# Patient Record
Sex: Female | Born: 1980
Health system: Southern US, Community
[De-identification: ages and names within clinical notes are randomized; demographics above are authoritative.]

## PROBLEM LIST (undated history)

## (undated) ENCOUNTER — Inpatient Hospital Stay (HOSPITAL_COMMUNITY): Payer: Self-pay

## (undated) DIAGNOSIS — I10 Essential (primary) hypertension: Secondary | ICD-10-CM

## (undated) DIAGNOSIS — IMO0002 Reserved for concepts with insufficient information to code with codable children: Secondary | ICD-10-CM

## (undated) DIAGNOSIS — R87619 Unspecified abnormal cytological findings in specimens from cervix uteri: Secondary | ICD-10-CM

## (undated) HISTORY — PX: CERVIX LESION DESTRUCTION: SHX591

## (undated) HISTORY — DX: Essential (primary) hypertension: I10

---

## 2003-11-23 ENCOUNTER — Emergency Department (HOSPITAL_COMMUNITY): Admission: EM | Admit: 2003-11-23 | Discharge: 2003-11-23 | Payer: Self-pay | Admitting: Emergency Medicine

## 2004-07-06 ENCOUNTER — Ambulatory Visit (HOSPITAL_COMMUNITY): Admission: RE | Admit: 2004-07-06 | Discharge: 2004-07-06 | Payer: Self-pay | Admitting: Obstetrics

## 2004-07-14 ENCOUNTER — Inpatient Hospital Stay (HOSPITAL_COMMUNITY): Admission: AD | Admit: 2004-07-14 | Discharge: 2004-07-14 | Payer: Self-pay | Admitting: Obstetrics

## 2004-07-27 ENCOUNTER — Inpatient Hospital Stay (HOSPITAL_COMMUNITY): Admission: AD | Admit: 2004-07-27 | Discharge: 2004-07-27 | Payer: Self-pay | Admitting: Obstetrics

## 2004-09-08 ENCOUNTER — Inpatient Hospital Stay (HOSPITAL_COMMUNITY): Admission: AD | Admit: 2004-09-08 | Discharge: 2004-09-08 | Payer: Self-pay | Admitting: Obstetrics and Gynecology

## 2004-11-08 ENCOUNTER — Inpatient Hospital Stay (HOSPITAL_COMMUNITY): Admission: AD | Admit: 2004-11-08 | Discharge: 2004-11-08 | Payer: Self-pay | Admitting: Obstetrics and Gynecology

## 2004-11-30 ENCOUNTER — Inpatient Hospital Stay (HOSPITAL_COMMUNITY): Admission: AD | Admit: 2004-11-30 | Discharge: 2004-11-30 | Payer: Self-pay | Admitting: Obstetrics & Gynecology

## 2004-12-01 ENCOUNTER — Inpatient Hospital Stay (HOSPITAL_COMMUNITY): Admission: AD | Admit: 2004-12-01 | Discharge: 2004-12-01 | Payer: Self-pay | Admitting: Obstetrics and Gynecology

## 2004-12-01 ENCOUNTER — Inpatient Hospital Stay (HOSPITAL_COMMUNITY): Admission: AD | Admit: 2004-12-01 | Discharge: 2004-12-01 | Payer: Self-pay | Admitting: *Deleted

## 2004-12-03 ENCOUNTER — Inpatient Hospital Stay (HOSPITAL_COMMUNITY): Admission: AD | Admit: 2004-12-03 | Discharge: 2004-12-03 | Payer: Self-pay | Admitting: Obstetrics & Gynecology

## 2004-12-15 ENCOUNTER — Inpatient Hospital Stay (HOSPITAL_COMMUNITY): Admission: AD | Admit: 2004-12-15 | Discharge: 2004-12-15 | Payer: Self-pay | Admitting: Obstetrics and Gynecology

## 2004-12-16 ENCOUNTER — Inpatient Hospital Stay (HOSPITAL_COMMUNITY): Admission: AD | Admit: 2004-12-16 | Discharge: 2004-12-16 | Payer: Self-pay | Admitting: Obstetrics and Gynecology

## 2004-12-23 ENCOUNTER — Inpatient Hospital Stay (HOSPITAL_COMMUNITY): Admission: AD | Admit: 2004-12-23 | Discharge: 2004-12-23 | Payer: Self-pay | Admitting: Obstetrics and Gynecology

## 2004-12-27 ENCOUNTER — Inpatient Hospital Stay (HOSPITAL_COMMUNITY): Admission: AD | Admit: 2004-12-27 | Discharge: 2004-12-27 | Payer: Self-pay | Admitting: Obstetrics & Gynecology

## 2005-01-04 ENCOUNTER — Inpatient Hospital Stay (HOSPITAL_COMMUNITY): Admission: AD | Admit: 2005-01-04 | Discharge: 2005-01-04 | Payer: Self-pay | Admitting: Obstetrics and Gynecology

## 2005-01-25 ENCOUNTER — Inpatient Hospital Stay (HOSPITAL_COMMUNITY): Admission: AD | Admit: 2005-01-25 | Discharge: 2005-01-25 | Payer: Self-pay | Admitting: Obstetrics & Gynecology

## 2005-01-27 ENCOUNTER — Inpatient Hospital Stay (HOSPITAL_COMMUNITY): Admission: AD | Admit: 2005-01-27 | Discharge: 2005-01-27 | Payer: Self-pay | Admitting: Obstetrics and Gynecology

## 2005-02-13 ENCOUNTER — Inpatient Hospital Stay (HOSPITAL_COMMUNITY): Admission: AD | Admit: 2005-02-13 | Discharge: 2005-02-16 | Payer: Self-pay | Admitting: Obstetrics and Gynecology

## 2005-02-14 ENCOUNTER — Encounter (INDEPENDENT_AMBULATORY_CARE_PROVIDER_SITE_OTHER): Payer: Self-pay | Admitting: *Deleted

## 2010-09-04 ENCOUNTER — Emergency Department (HOSPITAL_COMMUNITY)
Admission: EM | Admit: 2010-09-04 | Discharge: 2010-09-04 | Payer: Self-pay | Source: Home / Self Care | Admitting: Emergency Medicine
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
## 2012-03-30 ENCOUNTER — Inpatient Hospital Stay (HOSPITAL_COMMUNITY)
Admission: AD | Admit: 2012-03-30 | Discharge: 2012-03-31 | Disposition: A | Discharge status: Home / Self Care | Payer: Medicaid Other | Source: Ambulatory Visit | Attending: Obstetrics and Gynecology | Admitting: Obstetrics and Gynecology

## 2012-03-30 ENCOUNTER — Inpatient Hospital Stay (HOSPITAL_COMMUNITY): Payer: Medicaid Other

## 2012-03-30 ENCOUNTER — Encounter (HOSPITAL_COMMUNITY): Payer: Self-pay | Admitting: *Deleted

## 2012-03-30 DIAGNOSIS — A499 Bacterial infection, unspecified: Secondary | ICD-10-CM | POA: Insufficient documentation

## 2012-03-30 DIAGNOSIS — B9689 Other specified bacterial agents as the cause of diseases classified elsewhere: Secondary | ICD-10-CM | POA: Insufficient documentation

## 2012-03-30 DIAGNOSIS — N390 Urinary tract infection, site not specified: Secondary | ICD-10-CM | POA: Insufficient documentation

## 2012-03-30 DIAGNOSIS — O2390 Unspecified genitourinary tract infection in pregnancy, unspecified trimester: Secondary | ICD-10-CM | POA: Insufficient documentation

## 2012-03-30 DIAGNOSIS — R109 Unspecified abdominal pain: Secondary | ICD-10-CM | POA: Insufficient documentation

## 2012-03-30 DIAGNOSIS — N760 Acute vaginitis: Secondary | ICD-10-CM | POA: Insufficient documentation

## 2012-03-30 HISTORY — DX: Unspecified abnormal cytological findings in specimens from cervix uteri: R87.619

## 2012-03-30 HISTORY — DX: Reserved for concepts with insufficient information to code with codable children: IMO0002

## 2012-03-30 LAB — URINALYSIS, ROUTINE W REFLEX MICROSCOPIC
Bilirubin Urine: NEGATIVE
Glucose, UA: NEGATIVE mg/dL
Hgb urine dipstick: NEGATIVE
Ketones, ur: NEGATIVE mg/dL
Nitrite: NEGATIVE
Protein, ur: NEGATIVE mg/dL
Specific Gravity, Urine: 1.02 (ref 1.005–1.030)
Urobilinogen, UA: 0.2 mg/dL (ref 0.0–1.0)
pH: 6.5 (ref 5.0–8.0)

## 2012-03-30 LAB — CBC WITH DIFFERENTIAL/PLATELET
Basophils Absolute: 0 10*3/uL (ref 0.0–0.1)
Basophils Relative: 0 % (ref 0–1)
Eosinophils Absolute: 0.1 10*3/uL (ref 0.0–0.7)
Eosinophils Relative: 2 % (ref 0–5)
HCT: 36.2 % (ref 36.0–46.0)
Hemoglobin: 12.4 g/dL (ref 12.0–15.0)
Lymphocytes Relative: 27 % (ref 12–46)
Lymphs Abs: 1.7 10*3/uL (ref 0.7–4.0)
MCH: 30.3 pg (ref 26.0–34.0)
MCHC: 34.3 g/dL (ref 30.0–36.0)
MCV: 88.5 fL (ref 78.0–100.0)
Monocytes Absolute: 0.4 10*3/uL (ref 0.1–1.0)
Monocytes Relative: 6 % (ref 3–12)
Neutro Abs: 4.1 10*3/uL (ref 1.7–7.7)
Neutrophils Relative %: 65 % (ref 43–77)
Platelets: 198 10*3/uL (ref 150–400)
RBC: 4.09 MIL/uL (ref 3.87–5.11)
RDW: 13.9 % (ref 11.5–15.5)
WBC: 6.2 10*3/uL (ref 4.0–10.5)

## 2012-03-30 LAB — POCT PREGNANCY, URINE: Preg Test, Ur: POSITIVE — AB

## 2012-03-30 LAB — HCG, QUANTITATIVE, PREGNANCY: hCG, Beta Chain, Quant, S: 20081 m[IU]/mL — ABNORMAL HIGH (ref <5)

## 2012-03-30 LAB — WET PREP, GENITAL
Trich, Wet Prep: NONE SEEN
Yeast Wet Prep HPF POC: NONE SEEN

## 2012-03-30 LAB — ABO/RH: ABO/RH(D): B POS

## 2012-03-30 LAB — URINE MICROSCOPIC-ADD ON

## 2012-03-30 NOTE — MAU Provider Note (Signed)
History     CSN: [id number]  Arrival date and time: 03/30/12 1451    HPI [name] is a 31 y.o. female who presents to MAU for abdominal pain in early pregnancy. The pain started about a week ago and last night got really bad. She describes the pain as cramping that comes and goes. She rates the pain as 6/10 today. Associated symptoms include nausea, vomiting and fatigue. She noted spotting 5 days ago but none since. Last pap smear less than one year ago and was normal. Current sex partner > 1 year. Hx of Chlamydia. The history was provided by the patient.  OB History    Grav Para Term Preterm Abortions TAB SAB Ect Mult Living   5 2 1 1 2  2   1       Past Medical History  Diagnosis Date  . Abnormal Pap smear     Past Surgical History  Procedure Date  . Cervix lesion destruction     Family History  Problem Relation Age of Onset  . Hypertension Mother   . Hypertension Maternal Grandmother   . Hypertension Maternal Grandfather     History  Substance Use Topics  . Smoking status: Current Everyday Smoker -- 0.5 packs/day for 12 years    Types: Cigarettes  . Smokeless tobacco: Not on file  . Alcohol Use: Yes     socially    Allergies: No Known Allergies  Prescriptions prior to admission  Medication Sig Dispense Refill  . acetaminophen (TYLENOL) 325 MG tablet Take 650 mg by mouth every 6 (six) hours as needed. For pain        Review of Systems  Constitutional: Negative for fever, chills and weight loss. Malaise/fatigue: tired.  HENT: Negative for ear pain, nosebleeds, congestion, sore throat and neck pain.   Eyes: Negative for blurred vision, double vision, photophobia and pain.  Respiratory: Negative for cough, shortness of breath and wheezing.   Cardiovascular: Negative for chest pain, palpitations and leg swelling.  Gastrointestinal: Positive for heartburn, nausea, vomiting and abdominal pain. Negative for diarrhea and constipation.  Genitourinary: Positive  for frequency. Negative for dysuria and urgency.       Vaginal spotting last week, vaginal discharge  Musculoskeletal: Negative for myalgias and back pain.  Skin: Negative for itching and rash.  Neurological: Negative for dizziness, sensory change, speech change, seizures, weakness and headaches.  Endo/Heme/Allergies: Does not bruise/bleed easily.  Psychiatric/Behavioral: Negative for depression. The patient is not nervous/anxious.    Physical Exam   Blood pressure 93/61, pulse 78, temperature 97.8 F (36.6 C), temperature source Oral, resp. rate 16, height 5\' 4"  (1.626 m), weight 135 lb (61.236 kg), last menstrual period 02/19/2012.  Physical Exam  Nursing note and vitals reviewed. Constitutional: She is oriented to person, place, and time. She appears well-developed and well-nourished. No distress.  HENT:  Head: Normocephalic and atraumatic.  Eyes: EOM are normal.  Neck: Neck supple.  Cardiovascular: Normal rate.   Respiratory: Effort normal.  GI: Soft. There is tenderness. There is no rebound and no guarding.       Minimal tenderness lower abdomen with palpation  Genitourinary:       External genitalia without lesions. White discharge vaginal vault, no blood noted. Cervix closed, long, no CMT, minimal tenderness bilateral adnexa. Uterus approximately 6 to 8 week size.  Musculoskeletal: Normal range of motion.  Neurological: She is alert and oriented to person, place, and time.  Skin: Skin is warm and dry.  Psychiatric:  She has a normal mood and affect. Her behavior is normal. Judgment and thought content normal.   B+, normal CBC   Procedures  Study Result     *RADIOLOGY REPORT*  Clinical Data: Pregnant, spotting, pelvic pain  OBSTETRIC <14 WK ULTRASOUND  Technique: Transabdominal ultrasound was performed for evaluation  of the gestation as well as the maternal uterus and adnexal  regions.  Comparison: None.  Intrauterine gestational sac: Visualized/normal in shape.    Yolk sac: Present  Embryo: Present  Cardiac Activity: Present  Heart Rate: 98 bpm  CRL: 4.3 mm 6 w 1 d  [place] EDC: 11/22/2012  Maternal uterus/Adnexae:  Small subchorionic hemorrhage.  Right ovary is within normal limits, measuring 2.3 x 2.9 x 2.9 cm,  and is notable for a corpus luteum.  Left ovary is within normal limits, measuring 1.6 x 3.5 x 1.9 cm.  The  No free fluid.  IMPRESSION:  Single live intrauterine gestation with estimated gestational age [redacted]  weeks 1 day by crown-rump length.  Fetal heart rate 98 bpm.  Small subchorionic hemorrhage.  Original Report Authenticated By: Charline Bills, M.D.   Assessment: Viable IUP  Plan:  Start prenatal care  NOTE: Computer system went down prior to patient discharge. Remainder of visit was done on paper and sent to medical records. Kasir Hallenbeck 03/30/2012, 4:20 PM

## 2012-03-30 NOTE — MAU Note (Signed)
Pt states she had a +HPT two days ago, and has been cramping for the past week and a half.  She also states she is not emptying her bladder since the cramping started. Denies any bleeding for the past few days.

## 2012-03-31 LAB — GC/CHLAMYDIA PROBE AMP, GENITAL
Chlamydia, DNA Probe: NEGATIVE
GC Probe Amp, Genital: NEGATIVE

## 2012-04-01 LAB — URINE CULTURE

## 2012-04-14 NOTE — MAU Provider Note (Signed)
Attestation of Attending Supervision of Advanced Practitioner: Evaluation and management procedures were performed by the PA/NP/CNM/OB Fellow under my supervision/collaboration. Chart reviewed and agree with management and plan.  Genora Arp V 04/14/2012 6:34 AM    

## 2012-05-14 ENCOUNTER — Other Ambulatory Visit (HOSPITAL_COMMUNITY): Payer: Self-pay | Admitting: Nurse Practitioner

## 2012-05-14 DIAGNOSIS — Z3682 Encounter for antenatal screening for nuchal translucency: Secondary | ICD-10-CM

## 2012-05-14 LAB — OB RESULTS CONSOLE GC/CHLAMYDIA
Chlamydia: NEGATIVE
Gonorrhea: NEGATIVE

## 2012-05-14 LAB — SICKLE CELL SCREEN: Sickle Cell Screen: NORMAL

## 2012-05-14 LAB — OB RESULTS CONSOLE RPR: RPR: NONREACTIVE

## 2012-05-14 LAB — GLUCOSE TOLERANCE, 1 HOUR (50G) W/O FASTING: Glucose, 1 Hour GTT: 82

## 2012-05-14 LAB — OB RESULTS CONSOLE HGB/HCT, BLOOD: HCT: 34 %

## 2012-05-14 LAB — OB RESULTS CONSOLE ABO/RH: RH Type: POSITIVE

## 2012-05-14 LAB — OB RESULTS CONSOLE HEPATITIS B SURFACE ANTIGEN: Hepatitis B Surface Ag: NEGATIVE

## 2012-05-14 LAB — OB RESULTS CONSOLE VARICELLA ZOSTER ANTIBODY, IGG: Varicella: IMMUNE

## 2012-05-14 LAB — OB RESULTS CONSOLE ANTIBODY SCREEN: Antibody Screen: NEGATIVE

## 2012-05-14 LAB — OB RESULTS CONSOLE RUBELLA ANTIBODY, IGM: Rubella: IMMUNE

## 2012-05-14 LAB — CYTOLOGY - PAP: CYTOLOGY - PAP: NEGATIVE

## 2012-05-20 ENCOUNTER — Ambulatory Visit (HOSPITAL_COMMUNITY)
Admission: RE | Admit: 2012-05-20 | Discharge: 2012-05-20 | Disposition: A | Discharge status: Home / Self Care | Payer: Medicaid Other | Source: Ambulatory Visit | Attending: Nurse Practitioner | Admitting: Nurse Practitioner

## 2012-05-20 ENCOUNTER — Encounter (HOSPITAL_COMMUNITY): Payer: Self-pay

## 2012-05-20 VITALS — BP 99/61 | HR 100 | Wt 135.0 lb

## 2012-05-20 DIAGNOSIS — Z3682 Encounter for antenatal screening for nuchal translucency: Secondary | ICD-10-CM

## 2012-05-20 DIAGNOSIS — Z1389 Encounter for screening for other disorder: Secondary | ICD-10-CM

## 2012-05-20 DIAGNOSIS — O3510X0 Maternal care for (suspected) chromosomal abnormality in fetus, unspecified, not applicable or unspecified: Secondary | ICD-10-CM | POA: Insufficient documentation

## 2012-05-20 DIAGNOSIS — O351XX0 Maternal care for (suspected) chromosomal abnormality in fetus, not applicable or unspecified: Secondary | ICD-10-CM | POA: Insufficient documentation

## 2012-05-20 DIAGNOSIS — O09299 Supervision of pregnancy with other poor reproductive or obstetric history, unspecified trimester: Secondary | ICD-10-CM | POA: Insufficient documentation

## 2012-05-20 DIAGNOSIS — Z3689 Encounter for other specified antenatal screening: Secondary | ICD-10-CM | POA: Insufficient documentation

## 2012-05-20 NOTE — Progress Notes (Signed)
[name]  was seen today for an ultrasound appointment.  See full report in [facility].  Alpha Gula, MD  Single IUP at 13 0/7 weeks NT of 1.4 mm noted.  A nasal bone was visualized First trimester screen performed  Recommend follow up in 6 weeks for anatomic survey.  Also recommend MSAFP testing in the second trimester for ONTD screening.

## 2012-05-21 ENCOUNTER — Encounter: Payer: Self-pay | Admitting: Obstetrics and Gynecology

## 2012-05-21 DIAGNOSIS — O09299 Supervision of pregnancy with other poor reproductive or obstetric history, unspecified trimester: Secondary | ICD-10-CM

## 2012-05-26 ENCOUNTER — Encounter: Payer: Self-pay | Admitting: Obstetrics and Gynecology

## 2012-05-26 DIAGNOSIS — O09299 Supervision of pregnancy with other poor reproductive or obstetric history, unspecified trimester: Secondary | ICD-10-CM | POA: Insufficient documentation

## 2012-05-27 DIAGNOSIS — O09299 Supervision of pregnancy with other poor reproductive or obstetric history, unspecified trimester: Secondary | ICD-10-CM

## 2012-05-29 ENCOUNTER — Encounter: Payer: Self-pay | Admitting: Obstetrics & Gynecology

## 2012-05-29 ENCOUNTER — Ambulatory Visit (INDEPENDENT_AMBULATORY_CARE_PROVIDER_SITE_OTHER): Payer: Medicaid Other | Admitting: Obstetrics & Gynecology

## 2012-05-29 VITALS — BP 89/57 | Temp 97.1°F | Wt 135.8 lb

## 2012-05-29 DIAGNOSIS — N390 Urinary tract infection, site not specified: Secondary | ICD-10-CM

## 2012-05-29 DIAGNOSIS — F17200 Nicotine dependence, unspecified, uncomplicated: Secondary | ICD-10-CM

## 2012-05-29 DIAGNOSIS — O3440 Maternal care for other abnormalities of cervix, unspecified trimester: Secondary | ICD-10-CM

## 2012-05-29 DIAGNOSIS — O09299 Supervision of pregnancy with other poor reproductive or obstetric history, unspecified trimester: Secondary | ICD-10-CM

## 2012-05-29 DIAGNOSIS — O0990 Supervision of high risk pregnancy, unspecified, unspecified trimester: Secondary | ICD-10-CM

## 2012-05-29 DIAGNOSIS — F17210 Nicotine dependence, cigarettes, uncomplicated: Secondary | ICD-10-CM

## 2012-05-29 DIAGNOSIS — Z8619 Personal history of other infectious and parasitic diseases: Secondary | ICD-10-CM

## 2012-05-29 LAB — POCT URINALYSIS DIP (DEVICE)
Bilirubin Urine: NEGATIVE
Glucose, UA: NEGATIVE mg/dL
Nitrite: NEGATIVE
Urobilinogen, UA: 0.2 mg/dL (ref 0.0–1.0)
pH: 6 (ref 5.0–8.0)

## 2012-05-29 NOTE — Assessment & Plan Note (Signed)
Refused nicotine patches.  SW to address [place] quit line.  Pt aware that smoking can cause IUFD and placental abruption

## 2012-05-29 NOTE — Progress Notes (Signed)
U/S scheduled in MFM on 06/27/12 at 830 am.

## 2012-05-29 NOTE — Progress Notes (Signed)
Pulse- 82  Pt given "mom packet".  Pt already set up with [facility].

## 2012-05-29 NOTE — Progress Notes (Signed)
No nutrition or SW her today.  Nausea is better with phenergan.  Needs AFP only at next visit.  Needs anatomy US with MFM.  Unknown reason for 24-26 week fetal demise.  Will start weekly BPP at 28 weeks then 2x week testing at 32 weeks.  1+ protein on urine dip (clean catch).  Pt was treated for UTI in July but did not finish medication.  Will resend U cx today.

## 2012-05-29 NOTE — Progress Notes (Signed)
First visit with RD.  Client receives [facility].  Wt loss of 4.2# based on reported pg weight of 140.  Pt. Reports excessive nausea.  Now on Phenergan which seems to be helping with nausea.   Client reports eating 1 meal and 2-3 snacks/d.  Discussed importance of eating 5-6 small meals and snacks each day and other tips for combating nausea.  Discussed weight gain goals Of 25-35#.  F/U as needed. Candice C. Earlene Plater, MPH, RD, LDN

## 2012-05-29 NOTE — Patient Instructions (Signed)

## 2012-06-02 LAB — CULTURE, OB URINE

## 2012-06-26 ENCOUNTER — Ambulatory Visit (INDEPENDENT_AMBULATORY_CARE_PROVIDER_SITE_OTHER): Payer: Medicaid Other | Admitting: Advanced Practice Midwife

## 2012-06-26 VITALS — BP 100/63 | Temp 97.0°F | Wt 147.6 lb

## 2012-06-26 DIAGNOSIS — O3440 Maternal care for other abnormalities of cervix, unspecified trimester: Secondary | ICD-10-CM

## 2012-06-26 DIAGNOSIS — O0990 Supervision of high risk pregnancy, unspecified, unspecified trimester: Secondary | ICD-10-CM

## 2012-06-26 DIAGNOSIS — O09299 Supervision of pregnancy with other poor reproductive or obstetric history, unspecified trimester: Secondary | ICD-10-CM

## 2012-06-26 LAB — POCT URINALYSIS DIP (DEVICE)
Hgb urine dipstick: NEGATIVE
Ketones, ur: NEGATIVE mg/dL
Protein, ur: NEGATIVE mg/dL
pH: 6.5 (ref 5.0–8.0)

## 2012-06-26 NOTE — Progress Notes (Signed)
Doing well. Feeling fetal movement, denies vaginal bleeding, LOF, cramping/contractions. Fundal height U-1.  AFP drawn today.

## 2012-06-26 NOTE — Progress Notes (Signed)
Pulse: 86 Pt notices some swelling in her legs at end of day but goes away with rest. Has some pelvic soreness.

## 2012-06-27 ENCOUNTER — Encounter (HOSPITAL_COMMUNITY): Payer: Self-pay | Admitting: *Deleted

## 2012-06-27 ENCOUNTER — Ambulatory Visit (HOSPITAL_COMMUNITY)
Admission: RE | Admit: 2012-06-27 | Payer: Medicaid Other | Source: Ambulatory Visit | Attending: Nurse Practitioner | Admitting: Nurse Practitioner

## 2012-06-27 NOTE — Care Management (Signed)
Dr. Macon Large notified that patient called to reschedule detail [name]. Pt states she is unable to come until the end of November due to scheduling conflicts.

## 2012-07-02 ENCOUNTER — Ambulatory Visit (HOSPITAL_COMMUNITY): Payer: Medicaid Other

## 2012-07-10 ENCOUNTER — Encounter: Payer: Self-pay | Admitting: Advanced Practice Midwife

## 2012-07-24 ENCOUNTER — Ambulatory Visit (INDEPENDENT_AMBULATORY_CARE_PROVIDER_SITE_OTHER): Payer: Medicaid Other | Admitting: Obstetrics & Gynecology

## 2012-07-24 VITALS — BP 104/65 | Temp 97.4°F | Wt 161.8 lb

## 2012-07-24 DIAGNOSIS — O0990 Supervision of high risk pregnancy, unspecified, unspecified trimester: Secondary | ICD-10-CM

## 2012-07-24 DIAGNOSIS — Z8619 Personal history of other infectious and parasitic diseases: Secondary | ICD-10-CM

## 2012-07-24 DIAGNOSIS — O99330 Smoking (tobacco) complicating pregnancy, unspecified trimester: Secondary | ICD-10-CM

## 2012-07-24 DIAGNOSIS — O09299 Supervision of pregnancy with other poor reproductive or obstetric history, unspecified trimester: Secondary | ICD-10-CM

## 2012-07-24 LAB — POCT URINALYSIS DIP (DEVICE)
Leukocytes, UA: NEGATIVE
Protein, ur: NEGATIVE mg/dL
Specific Gravity, Urine: 1.015 (ref 1.005–1.030)
Urobilinogen, UA: 0.2 mg/dL (ref 0.0–1.0)

## 2012-07-24 NOTE — Patient Instructions (Signed)
Breastfeeding Deciding to breastfeed is one of the best choices you can make for you and your baby. The information that follows gives a brief overview of the benefits of breastfeeding as well as common topics surrounding breastfeeding. BENEFITS OF BREASTFEEDING For the baby  The first milk (colostrum) helps the baby's digestive system function better.   There are antibodies in the mother's milk that help the baby fight off infections.   The baby has a lower incidence of asthma, allergies, and sudden infant death syndrome (SIDS).   The nutrients in breast milk are better for the baby than infant formulas, and breast milk helps the baby's brain grow better.   Babies who breastfeed have less gas, colic, and constipation.  For the mother  Breastfeeding helps develop a very special bond between the mother and her baby.   Breastfeeding is convenient, always available at the correct temperature, and costs nothing.   Breastfeeding burns calories in the mother and helps her lose weight that was gained during pregnancy.   Breastfeeding makes the uterus contract back down to normal size faster and slows bleeding following delivery.   Breastfeeding mothers have a lower risk of developing breast cancer.  BREASTFEEDING FREQUENCY  A healthy, full-term baby may breastfeed as often as every hour or space his or her feedings to every 3 hours.   Watch your baby for signs of hunger. Nurse your baby if he or she shows signs of hunger. How often you nurse will vary from baby to baby.   Nurse as often as the baby requests, or when you feel the need to reduce the fullness of your breasts.   Awaken the baby if it has been 3 4 hours since the last feeding.   Frequent feeding will help the mother make more milk and will help prevent problems, such as sore nipples and engorgement of the breasts.  BABY'S POSITION AT THE BREAST  Whether lying down or sitting, be sure that the baby's tummy is  facing your tummy.   Support the breast with 4 fingers underneath the breast and the thumb above. Make sure your fingers are well away from the nipple and baby's mouth.   Stroke the baby's lips gently with your finger or nipple.   When the baby's mouth is open wide enough, place all of your nipple and as much of the areola as possible into your baby's mouth.   Pull the baby in close so the tip of the nose and the baby's cheeks touch the breast during the feeding.  FEEDINGS AND SUCTION  The length of each feeding varies from baby to baby and from feeding to feeding.   The baby must suck about 2 3 minutes for your milk to get to him or her. This is called a "let down." For this reason, allow the baby to feed on each breast as long as he or she wants. Your baby will end the feeding when he or she has received the right balance of nutrients.   To break the suction, put your finger into the corner of the baby's mouth and slide it between his or her gums before removing your breast from his or her mouth. This will help prevent sore nipples.  HOW TO TELL WHETHER YOUR BABY IS GETTING ENOUGH BREAST MILK. Wondering whether or not your baby is getting enough milk is a common concern among mothers. You can be assured that your baby is getting enough milk if:   Your baby is actively   sucking and you hear swallowing.   Your baby seems relaxed and satisfied after a feeding.   Your baby nurses at least 8 12 times in a 24 hour time period. Nurse your baby until he or she unlatches or falls asleep at the first breast (at least 10 20 minutes), then offer the second side.   Your baby is wetting 5 6 disposable diapers (6 8 cloth diapers) in a 24 hour period by 5 6 days of age.   Your baby is having at least 3 4 stools every 24 hours for the first 6 weeks. The stool should be soft and yellow.   Your baby should gain 4 7 ounces per week after he or she is 4 days old.   Your breasts feel softer  after nursing.  REDUCING BREAST ENGORGEMENT  In the first week after your baby is born, you may experience signs of breast engorgement. When breasts are engorged, they feel heavy, warm, full, and may be tender to the touch. You can reduce engorgement if you:   Nurse frequently, every 2 3 hours. Mothers who breastfeed early and often have fewer problems with engorgement.   Place light ice packs on your breasts for 10 20 minutes between feedings. This reduces swelling. Wrap the ice packs in a lightweight towel to protect your skin. Bags of frozen vegetables work well for this purpose.   Take a warm shower or apply warm, moist heat to your breast for 5 10 minutes just before each feeding. This increases circulation and helps the milk flow.   Gently massage your breast before and during the feeding. Using your finger tips, massage from the chest wall towards your nipple in a circular motion.   Make sure that the baby empties at least one breast at every feeding before switching sides.   Use a breast pump to empty the breasts if your baby is sleepy or not nursing well. You may also want to pump if you are returning to work oryou feel you are getting engorged.   Avoid bottle feeds, pacifiers, or supplemental feedings of water or juice in place of breastfeeding. Breast milk is all the food your baby needs. It is not necessary for your baby to have water or formula. In fact, to help your breasts make more milk, it is best not to give your baby supplemental feedings during the early weeks.   Be sure the baby is latched on and positioned properly while breastfeeding.   Wear a supportive bra, avoiding underwire styles.   Eat a balanced diet with enough fluids.   Rest often, relax, and take your prenatal vitamins to prevent fatigue, stress, and anemia.  If you follow these suggestions, your engorgement should improve in 24 48 hours. If you are still experiencing difficulty, call your  lactation consultant or caregiver.  CARING FOR YOURSELF Take care of your breasts  Bathe or shower daily.   Avoid using soap on your nipples.   Start feedings on your left breast at one feeding and on your right breast at the next feeding.   You will notice an increase in your milk supply 2 5 days after delivery. You may feel some discomfort from engorgement, which makes your breasts very firm and often tender. Engorgement "peaks" out within 24 48 hours. In the meantime, apply warm moist towels to your breasts for 5 10 minutes before feeding. Gentle massage and expression of some milk before feeding will soften your breasts, making it easier for your   baby to latch on.   Wear a well-fitting nursing bra, and air dry your nipples for a 3 4minutes after each feeding.   Only use cotton bra pads.   Only use pure lanolin on your nipples after nursing. You do not need to wash it off before feeding the baby again. Another option is to express a few drops of breast milk and gently massage it into your nipples.  Take care of yourself  Eat well-balanced meals and nutritious snacks.   Drinking milk, fruit juice, and water to satisfy your thirst (about 8 glasses a day).   Get plenty of rest.  Avoid foods that you notice affect the baby in a bad way.  SEEK MEDICAL CARE IF:   You have difficulty with breastfeeding and need help.   You have a hard, red, sore area on your breast that is accompanied by a fever.   Your baby is too sleepy to eat well or is having trouble sleeping.   Your baby is wetting less than 6 diapers a day, by 5 days of age.   Your baby's skin or white part of his or her eyes is more yellow than it was in the hospital.   You feel depressed.  Document Released: 08/27/2005 Document Revised: 02/26/2012 Document Reviewed: 11/25/2011 [employer] Patient Information 2013 [employer].  

## 2012-07-24 NOTE — Progress Notes (Signed)
Good fetal movement, no complaints.[place] is scheduled for 11/25.

## 2012-07-24 NOTE — Progress Notes (Signed)
P = 84   Pt states she is rarely nauseous now and has improved appetite. Pt has had 14 lb weight gain over last 4 wks.

## 2012-08-04 ENCOUNTER — Ambulatory Visit (HOSPITAL_COMMUNITY)
Admission: RE | Admit: 2012-08-04 | Discharge: 2012-08-04 | Disposition: A | Discharge status: Home / Self Care | Payer: Managed Care, Other (non HMO) | Source: Ambulatory Visit | Attending: Obstetrics & Gynecology | Admitting: Obstetrics & Gynecology

## 2012-08-04 VITALS — BP 98/59 | HR 90 | Wt 166.8 lb

## 2012-08-04 DIAGNOSIS — Z1389 Encounter for screening for other disorder: Secondary | ICD-10-CM | POA: Insufficient documentation

## 2012-08-04 DIAGNOSIS — O0990 Supervision of high risk pregnancy, unspecified, unspecified trimester: Secondary | ICD-10-CM

## 2012-08-04 DIAGNOSIS — Z363 Encounter for antenatal screening for malformations: Secondary | ICD-10-CM | POA: Insufficient documentation

## 2012-08-04 DIAGNOSIS — O358XX0 Maternal care for other (suspected) fetal abnormality and damage, not applicable or unspecified: Secondary | ICD-10-CM | POA: Insufficient documentation

## 2012-08-04 DIAGNOSIS — O09299 Supervision of pregnancy with other poor reproductive or obstetric history, unspecified trimester: Secondary | ICD-10-CM | POA: Insufficient documentation

## 2012-08-04 NOTE — Progress Notes (Signed)
[name] had an ultrasound appointment today.  Please see [facility] report for details.  Comments An active singleton fetus is observed.  Biometry is appropriate for gestational age.  Amniotic fluid volume is normal.  Screening survey of the fetal anatomy was performed and no dysmorphic features are detected.  Impression Active singleton fetus Normal anatomic survey H/o IUFD at 26 wks in previous gestation.  Recommendations 1. I recommend interval growth every 4 weeks; 2. I recommend twice weekly NST with weekly AFI beginning at around 32 weeks. 3. Elective induction may be considered at [redacted] weeks gestational age.  Amber Boga, MD, MS, FACOG Assistant Professor Section of Maternal-Fetal Medicine [facility]

## 2012-08-11 ENCOUNTER — Encounter: Payer: Self-pay | Admitting: Obstetrics & Gynecology

## 2012-08-13 ENCOUNTER — Other Ambulatory Visit (HOSPITAL_COMMUNITY): Payer: Self-pay | Admitting: Obstetrics and Gynecology

## 2012-08-13 DIAGNOSIS — O99330 Smoking (tobacco) complicating pregnancy, unspecified trimester: Secondary | ICD-10-CM

## 2012-08-13 DIAGNOSIS — O09299 Supervision of pregnancy with other poor reproductive or obstetric history, unspecified trimester: Secondary | ICD-10-CM

## 2012-08-14 ENCOUNTER — Ambulatory Visit (INDEPENDENT_AMBULATORY_CARE_PROVIDER_SITE_OTHER): Payer: Medicaid Other | Admitting: Obstetrics & Gynecology

## 2012-08-14 VITALS — BP 93/61 | Temp 97.2°F | Wt 165.7 lb

## 2012-08-14 DIAGNOSIS — O09299 Supervision of pregnancy with other poor reproductive or obstetric history, unspecified trimester: Secondary | ICD-10-CM

## 2012-08-14 DIAGNOSIS — O0990 Supervision of high risk pregnancy, unspecified, unspecified trimester: Secondary | ICD-10-CM

## 2012-08-14 LAB — POCT URINALYSIS DIP (DEVICE)
Glucose, UA: NEGATIVE mg/dL
Hgb urine dipstick: NEGATIVE
Nitrite: NEGATIVE
Urobilinogen, UA: 0.2 mg/dL (ref 0.0–1.0)

## 2012-08-14 NOTE — Progress Notes (Signed)
Still occasional nausea, no emesis or reflux. Not sleeping well, may try Unisom or Benadryl.

## 2012-08-14 NOTE — Patient Instructions (Signed)
Pregnancy - Second Trimester The second trimester of pregnancy (3 to 6 months) is a period of rapid growth for you and your baby. At the end of the sixth month, your baby is about 9 inches long and weighs 1 1/2 pounds. You will begin to feel the baby move between 18 and 20 weeks of the pregnancy. This is called quickening. Weight gain is faster. A clear fluid (colostrum) may leak out of your breasts. You may feel small contractions of the womb (uterus). This is known as false labor or Braxton-Hicks contractions. This is like a practice for labor when the baby is ready to be born. Usually, the problems with morning sickness have usually passed by the end of your first trimester. Some women develop small dark blotches (called cholasma, mask of pregnancy) on their face that usually goes away after the baby is born. Exposure to the sun makes the blotches worse. Acne may also develop in some pregnant women and pregnant women who have acne, may find that it goes away. PRENATAL EXAMS  Blood work may continue to be done during prenatal exams. These tests are done to check on your health and the probable health of your baby. Blood work is used to follow your blood levels (hemoglobin). Anemia (low hemoglobin) is common during pregnancy. Iron and vitamins are given to help prevent this. You will also be checked for diabetes between 24 and 28 weeks of the pregnancy. Some of the previous blood tests may be repeated.  The size of the uterus is measured during each visit. This is to make sure that the baby is continuing to grow properly according to the dates of the pregnancy.  Your blood pressure is checked every prenatal visit. This is to make sure you are not getting toxemia.  Your urine is checked to make sure you do not have an infection, diabetes or protein in the urine.  Your weight is checked often to make sure gains are happening at the suggested rate. This is to ensure that both you and your baby are growing  normally.  Sometimes, an ultrasound is performed to confirm the proper growth and development of the baby. This is a test which bounces harmless sound waves off the baby so your caregiver can more accurately determine due dates. Sometimes, a specialized test is done on the amniotic fluid surrounding the baby. This test is called an amniocentesis. The amniotic fluid is obtained by sticking a needle into the belly (abdomen). This is done to check the chromosomes in instances where there is a concern about possible genetic problems with the baby. It is also sometimes done near the end of pregnancy if an early delivery is required. In this case, it is done to help make sure the baby's lungs are mature enough for the baby to live outside of the womb. CHANGES OCCURING IN THE SECOND TRIMESTER OF PREGNANCY Your body goes through many changes during pregnancy. They vary from person to person. Talk to your caregiver about changes you notice that you are concerned about.  During the second trimester, you will likely have an increase in your appetite. It is normal to have cravings for certain foods. This varies from person to person and pregnancy to pregnancy.  Your lower abdomen will begin to bulge.  You may have to urinate more often because the uterus and baby are pressing on your bladder. It is also common to get more bladder infections during pregnancy (pain with urination). You can help this by   drinking lots of fluids and emptying your bladder before and after intercourse.  You may begin to get stretch marks on your hips, abdomen, and breasts. These are normal changes in the body during pregnancy. There are no exercises or medications to take that prevent this change.  You may begin to develop swollen and bulging veins (varicose veins) in your legs. Wearing support hose, elevating your feet for 15 minutes, 3 to 4 times a day and limiting salt in your diet helps lessen the problem.  Heartburn may develop  as the uterus grows and pushes up against the stomach. Antacids recommended by your caregiver helps with this problem. Also, eating smaller meals 4 to 5 times a day helps.  Constipation can be treated with a stool softener or adding bulk to your diet. Drinking lots of fluids, vegetables, fruits, and whole grains are helpful.  Exercising is also helpful. If you have been very active up until your pregnancy, most of these activities can be continued during your pregnancy. If you have been less active, it is helpful to start an exercise program such as walking.  Hemorrhoids (varicose veins in the rectum) may develop at the end of the second trimester. Warm sitz baths and hemorrhoid cream recommended by your caregiver helps hemorrhoid problems.  Backaches may develop during this time of your pregnancy. Avoid heavy lifting, wear low heal shoes and practice good posture to help with backache problems.  Some pregnant women develop tingling and numbness of their hand and fingers because of swelling and tightening of ligaments in the wrist (carpel tunnel syndrome). This goes away after the baby is born.  As your breasts enlarge, you may have to get a bigger bra. Get a comfortable, cotton, support bra. Do not get a nursing bra until the last month of the pregnancy if you will be nursing the baby.  You may get a dark line from your belly button to the pubic area called the linea nigra.  You may develop rosy cheeks because of increase blood flow to the face.  You may develop spider looking lines of the face, neck, arms and chest. These go away after the baby is born. HOME CARE INSTRUCTIONS   It is extremely important to avoid all smoking, herbs, alcohol, and unprescribed drugs during your pregnancy. These chemicals affect the formation and growth of the baby. Avoid these chemicals throughout the pregnancy to ensure the delivery of a healthy infant.  Most of your home care instructions are the same as  suggested for the first trimester of your pregnancy. Keep your caregiver's appointments. Follow your caregiver's instructions regarding medication use, exercise and diet.  During pregnancy, you are providing food for you and your baby. Continue to eat regular, well-balanced meals. Choose foods such as meat, fish, milk and other low fat dairy products, vegetables, fruits, and whole-grain breads and cereals. Your caregiver will tell you of the ideal weight gain.  A physical sexual relationship may be continued up until near the end of pregnancy if there are no other problems. Problems could include early (premature) leaking of amniotic fluid from the membranes, vaginal bleeding, abdominal pain, or other medical or pregnancy problems.  Exercise regularly if there are no restrictions. Check with your caregiver if you are unsure of the safety of some of your exercises. The greatest weight gain will occur in the last 2 trimesters of pregnancy. Exercise will help you:  Control your weight.  Get you in shape for labor and delivery.  Lose weight   after you have the baby.  Wear a good support or jogging bra for breast tenderness during pregnancy. This may help if worn during sleep. Pads or tissues may be used in the bra if you are leaking colostrum.  Do not use hot tubs, steam rooms or saunas throughout the pregnancy.  Wear your seat belt at all times when driving. This protects you and your baby if you are in an accident.  Avoid raw meat, uncooked cheese, cat litter boxes and soil used by cats. These carry germs that can cause birth defects in the baby.  The second trimester is also a good time to visit your dentist for your dental health if this has not been done yet. Getting your teeth cleaned is OK. Use a soft toothbrush. Brush gently during pregnancy.  It is easier to loose urine during pregnancy. Tightening up and strengthening the pelvic muscles will help with this problem. Practice stopping your  urination while you are going to the bathroom. These are the same muscles you need to strengthen. It is also the muscles you would use as if you were trying to stop from passing gas. You can practice tightening these muscles up 10 times a set and repeating this about 3 times per day. Once you know what muscles to tighten up, do not perform these exercises during urination. It is more likely to contribute to an infection by backing up the urine.  Ask for help if you have financial, counseling or nutritional needs during pregnancy. Your caregiver will be able to offer counseling for these needs as well as refer you for other special needs.  Your skin may become oily. If so, wash your face with mild soap, use non-greasy moisturizer and oil or cream based makeup. MEDICATIONS AND DRUG USE IN PREGNANCY  Take prenatal vitamins as directed. The vitamin should contain 1 milligram of folic acid. Keep all vitamins out of reach of children. Only a couple vitamins or tablets containing iron may be fatal to a baby or young child when ingested.  Avoid use of all medications, including herbs, over-the-counter medications, not prescribed or suggested by your caregiver. Only take over-the-counter or prescription medicines for pain, discomfort, or fever as directed by your caregiver. Do not use aspirin.  Let your caregiver also know about herbs you may be using.  Alcohol is related to a number of birth defects. This includes fetal alcohol syndrome. All alcohol, in any form, should be avoided completely. Smoking will cause low birth rate and premature babies.  Street or illegal drugs are very harmful to the baby. They are absolutely forbidden. A baby born to an addicted mother will be addicted at birth. The baby will go through the same withdrawal an adult does. SEEK MEDICAL CARE IF:  You have any concerns or worries during your pregnancy. It is better to call with your questions if you feel they cannot wait, rather  than worry about them. SEEK IMMEDIATE MEDICAL CARE IF:   An unexplained oral temperature above 102 F (38.9 C) develops, or as your caregiver suggests.  You have leaking of fluid from the vagina (birth canal). If leaking membranes are suspected, take your temperature and tell your caregiver of this when you call.  There is vaginal spotting, bleeding, or passing clots. Tell your caregiver of the amount and how many pads are used. Light spotting in pregnancy is common, especially following intercourse.  You develop a bad smelling vaginal discharge with a change in the color from clear   to white.  You continue to feel sick to your stomach (nauseated) and have no relief from remedies suggested. You vomit blood or coffee ground-like materials.  You lose more than 2 pounds of weight or gain more than 2 pounds of weight over 1 week, or as suggested by your caregiver.  You notice swelling of your face, hands, feet, or legs.  You get exposed to German measles and have never had them.  You are exposed to fifth disease or chickenpox.  You develop belly (abdominal) pain. Round ligament discomfort is a common non-cancerous (benign) cause of abdominal pain in pregnancy. Your caregiver still must evaluate you.  You develop a bad headache that does not go away.  You develop fever, diarrhea, pain with urination, or shortness of breath.  You develop visual problems, blurry, or double vision.  You fall or are in a car accident or any kind of trauma.  There is mental or physical violence at home. Document Released: 08/21/2001 Document Revised: 11/19/2011 Document Reviewed: 02/23/2009 [employer] Patient Information 2013 [employer].  

## 2012-08-14 NOTE — Progress Notes (Signed)
P = 85  Pt cannot stay for 1hr GTT- will do @ next visit.

## 2012-09-02 ENCOUNTER — Ambulatory Visit (HOSPITAL_COMMUNITY)
Admission: RE | Admit: 2012-09-02 | Discharge: 2012-09-02 | Disposition: A | Discharge status: Home / Self Care | Payer: Managed Care, Other (non HMO) | Source: Ambulatory Visit | Attending: Obstetrics & Gynecology | Admitting: Obstetrics & Gynecology

## 2012-09-02 ENCOUNTER — Other Ambulatory Visit: Payer: Self-pay | Admitting: Maternal and Fetal Medicine

## 2012-09-02 VITALS — BP 97/54 | HR 92 | Wt 174.2 lb

## 2012-09-02 DIAGNOSIS — O09299 Supervision of pregnancy with other poor reproductive or obstetric history, unspecified trimester: Secondary | ICD-10-CM

## 2012-09-02 DIAGNOSIS — Z3049 Encounter for surveillance of other contraceptives: Secondary | ICD-10-CM | POA: Insufficient documentation

## 2012-09-02 DIAGNOSIS — O99330 Smoking (tobacco) complicating pregnancy, unspecified trimester: Secondary | ICD-10-CM

## 2012-09-04 ENCOUNTER — Ambulatory Visit (INDEPENDENT_AMBULATORY_CARE_PROVIDER_SITE_OTHER): Payer: Medicaid Other | Admitting: Family Medicine

## 2012-09-04 VITALS — BP 95/64 | Temp 97.1°F | Wt 171.4 lb

## 2012-09-04 DIAGNOSIS — N949 Unspecified condition associated with female genital organs and menstrual cycle: Secondary | ICD-10-CM

## 2012-09-04 DIAGNOSIS — O09299 Supervision of pregnancy with other poor reproductive or obstetric history, unspecified trimester: Secondary | ICD-10-CM

## 2012-09-04 DIAGNOSIS — O0993 Supervision of high risk pregnancy, unspecified, third trimester: Secondary | ICD-10-CM

## 2012-09-04 LAB — CBC
MCH: 31.1 pg (ref 26.0–34.0)
MCHC: 34.7 g/dL (ref 30.0–36.0)
Platelets: 156 10*3/uL (ref 150–400)
RBC: 3.66 MIL/uL — ABNORMAL LOW (ref 3.87–5.11)

## 2012-09-04 LAB — POCT URINALYSIS DIP (DEVICE)
Hgb urine dipstick: NEGATIVE
Protein, ur: NEGATIVE mg/dL
Specific Gravity, Urine: 1.02 (ref 1.005–1.030)
Urobilinogen, UA: 0.2 mg/dL (ref 0.0–1.0)

## 2012-09-04 MED ORDER — CYCLOBENZAPRINE HCL 10 MG PO TABS: 10.0000 mg | ORAL_TABLET | Freq: Every evening | ORAL | Status: DC | PRN

## 2012-09-04 NOTE — Progress Notes (Signed)
Round ligament pain, worse with walking and at night - will try flexeril at night. No discharge/dysuria. No ctx, bleeding, LOF. Hx IUFD, BPP weekly starting now, twice weekly NST starting 32 weeks. Growth sono 12/24 EFW 50% - repeat 4 weeks (scheduled 1/22)

## 2012-09-04 NOTE — Progress Notes (Signed)
p=93 Pt. States pain and pressure can be so severe the she can hardly walk at times.  Pt. Has tried taking Tylenol for the pain but it doesn't help.

## 2012-09-04 NOTE — Patient Instructions (Addendum)
Round Ligament Pain The round ligament is made up of muscle and fibrous tissue. It is attached to the uterus near the fallopian tube. The round ligament is located on both sides of the uterus and helps support the position of the uterus. It usually begins in the second trimester of pregnancy when the uterus comes out of the pelvis. The pain can come and go until the baby is delivered. Round ligament pain is not a serious problem and does not cause harm to the baby. CAUSE During pregnancy the uterus grows the most from the second trimester to delivery. As it grows, it stretches and slightly twists the round ligaments. When the uterus leans from one side to the other, the round ligament on the opposite side pulls and stretches. This can cause pain. SYMPTOMS  Pain can occur on one side or both sides. The pain is usually a short, sharp, and pinching-like. Sometimes it can be a dull, lingering and aching pain. The pain is located in the lower side of the abdomen or in the groin. The pain is internal and usually starts deep in the groin and moves up to the outside of the hip area. Pain can occur with:  Sudden change in position like getting out of bed or a chair.  Rolling over in bed.  Coughing or sneezing.  Walking too much.  Any type of physical activity. DIAGNOSIS  Your caregiver will make sure there are no serious problems causing the pain. When nothing serious is found, the symptoms usually indicate that the pain is from the round ligament. TREATMENT   Sit down and relax when the pain starts.  Flex your knees up to your belly.  Lay on your side with a pillow under your belly (abdomen) and another one between your legs.  Sit in a hot bath for 15 to 20 minutes or until the pain goes away. HOME CARE INSTRUCTIONS   Only take over-the-counter or prescriptions medicines for pain, discomfort or fever as directed by your caregiver.  Sit and stand slowly.  Avoid long walks if it causes  pain.  Stop or lessen your physical activities if it causes pain. SEEK MEDICAL CARE IF:   The pain does not go away with any of your treatment.  You need stronger medication for the pain.  You develop back pain that you did not have before with the side pain. SEEK IMMEDIATE MEDICAL CARE IF:   You develop a temperature of 102 F (38.9 C) or higher.  You develop uterine contractions.  You develop vaginal bleeding.  You develop nausea, vomiting or diarrhea.  You develop chills.  You have pain when you urinate. Document Released: 06/05/2008 Document Revised: 11/19/2011 Document Reviewed: 06/05/2008 [facility] Patient Information 2013 [place], [place].  Pregnancy - Third Trimester The third trimester of pregnancy (the last 3 months) is a period of the most rapid growth for you and your baby. The baby approaches a length of 20 inches and a weight of 6 to 10 pounds. The baby is adding on fat and getting ready for life outside your body. While inside, babies have periods of sleeping and waking, suck their thumbs, and hiccups. You can often feel small contractions of the uterus. This is false labor. It is also called Braxton-Hicks contractions. This is like a practice for labor. The usual problems in this stage of pregnancy include more difficulty breathing, swelling of the hands and feet from water retention, and having to urinate more often because of the uterus and  baby pressing on your bladder.  PRENATAL EXAMS  Blood work may continue to be done during prenatal exams. These tests are done to check on your health and the probable health of your baby. Blood work is used to follow your blood levels (hemoglobin). Anemia (low hemoglobin) is common during pregnancy. Iron and vitamins are given to help prevent this. You may also continue to be checked for diabetes. Some of the past blood tests may be done again.  The size of the uterus is measured during each visit. This makes sure your baby is  growing properly according to your pregnancy dates.  Your blood pressure is checked every prenatal visit. This is to make sure you are not getting toxemia.  Your urine is checked every prenatal visit for infection, diabetes and protein.  Your weight is checked at each visit. This is done to make sure gains are happening at the suggested rate and that you and your baby are growing normally.  Sometimes, an ultrasound is performed to confirm the position and the proper growth and development of the baby. This is a test done that bounces harmless sound waves off the baby so your caregiver can more accurately determine due dates.  Discuss the type of pain medication and anesthesia you will have during your labor and delivery.  Discuss the possibility and anesthesia if a Cesarean Section might be necessary.  Inform your caregiver if there is any mental or physical violence at home. Sometimes, a specialized non-stress test, contraction stress test and biophysical profile are done to make sure the baby is not having a problem. Checking the amniotic fluid surrounding the baby is called an amniocentesis. The amniotic fluid is removed by sticking a needle into the belly (abdomen). This is sometimes done near the end of pregnancy if an early delivery is required. In this case, it is done to help make sure the baby's lungs are mature enough for the baby to live outside of the womb. If the lungs are not mature and it is unsafe to deliver the baby, an injection of cortisone medication is given to the mother 1 to 2 days before the delivery. This helps the baby's lungs mature and makes it safer to deliver the baby. CHANGES OCCURING IN THE THIRD TRIMESTER OF PREGNANCY Your body goes through many changes during pregnancy. They vary from person to person. Talk to your caregiver about changes you notice and are concerned about.  During the last trimester, you have probably had an increase in your appetite. It is  normal to have cravings for certain foods. This varies from person to person and pregnancy to pregnancy.  You may begin to get stretch marks on your hips, abdomen, and breasts. These are normal changes in the body during pregnancy. There are no exercises or medications to take which prevent this change.  Constipation may be treated with a stool softener or adding bulk to your diet. Drinking lots of fluids, fiber in vegetables, fruits, and whole grains are helpful.  Exercising is also helpful. If you have been very active up until your pregnancy, most of these activities can be continued during your pregnancy. If you have been less active, it is helpful to start an exercise program such as walking. Consult your caregiver before starting exercise programs.  Avoid all smoking, alcohol, un-prescribed drugs, herbs and "street drugs" during your pregnancy. These chemicals affect the formation and growth of the baby. Avoid chemicals throughout the pregnancy to ensure the delivery of a healthy  infant.  Backache, varicose veins and hemorrhoids may develop or get worse.  You will tire more easily in the third trimester, which is normal.  The baby's movements may be stronger and more often.  You may become short of breath easily.  Your belly button may stick out.  A yellow discharge may leak from your breasts called colostrum.  You may have a bloody mucus discharge. This usually occurs a few days to a week before labor begins. HOME CARE INSTRUCTIONS   Keep your caregiver's appointments. Follow your caregiver's instructions regarding medication use, exercise, and diet.  During pregnancy, you are providing food for you and your baby. Continue to eat regular, well-balanced meals. Choose foods such as meat, fish, milk and other low fat dairy products, vegetables, fruits, and whole-grain breads and cereals. Your caregiver will tell you of the ideal weight gain.  A physical sexual relationship may be  continued throughout pregnancy if there are no other problems such as early (premature) leaking of amniotic fluid from the membranes, vaginal bleeding, or belly (abdominal) pain.  Exercise regularly if there are no restrictions. Check with your caregiver if you are unsure of the safety of your exercises. Greater weight gain will occur in the last 2 trimesters of pregnancy. Exercising helps:  Control your weight.  Get you in shape for labor and delivery.  You lose weight after you deliver.  Rest a lot with legs elevated, or as needed for leg cramps or low back pain.  Wear a good support or jogging bra for breast tenderness during pregnancy. This may help if worn during sleep. Pads or tissues may be used in the bra if you are leaking colostrum.  Do not use hot tubs, steam rooms, or saunas.  Wear your seat belt when driving. This protects you and your baby if you are in an accident.  Avoid raw meat, cat litter boxes and soil used by cats. These carry germs that can cause birth defects in the baby.  It is easier to loose urine during pregnancy. Tightening up and strengthening the pelvic muscles will help with this problem. You can practice stopping your urination while you are going to the bathroom. These are the same muscles you need to strengthen. It is also the muscles you would use if you were trying to stop from passing gas. You can practice tightening these muscles up 10 times a set and repeating this about 3 times per day. Once you know what muscles to tighten up, do not perform these exercises during urination. It is more likely to cause an infection by backing up the urine.  Ask for help if you have financial, counseling or nutritional needs during pregnancy. Your caregiver will be able to offer counseling for these needs as well as refer you for other special needs.  Make a list of emergency phone numbers and have them available.  Plan on getting help from family or friends when you  go home from the hospital.  Make a trial run to the hospital.  Take prenatal classes with the father to understand, practice and ask questions about the labor and delivery.  Prepare the baby's room/nursery.  Do not travel out of the city unless it is absolutely necessary and with the advice of your caregiver.  Wear only low or no heal shoes to have better balance and prevent falling. MEDICATIONS AND DRUG USE IN PREGNANCY  Take prenatal vitamins as directed. The vitamin should contain 1 milligram of folic acid. Keep all  vitamins out of reach of children. Only a couple vitamins or tablets containing iron may be fatal to a baby or young child when ingested.  Avoid use of all medications, including herbs, over-the-counter medications, not prescribed or suggested by your caregiver. Only take over-the-counter or prescription medicines for pain, discomfort, or fever as directed by your caregiver. Do not use aspirin, ibuprofen (Motrin, Advil, Nuprin) or naproxen (Aleve) unless OK'd by your caregiver.  Let your caregiver also know about herbs you may be using.  Alcohol is related to a number of birth defects. This includes fetal alcohol syndrome. All alcohol, in any form, should be avoided completely. Smoking will cause low birth rate and premature babies.  Street/illegal drugs are very harmful to the baby. They are absolutely forbidden. A baby born to an addicted mother will be addicted at birth. The baby will go through the same withdrawal an adult does. SEEK MEDICAL CARE IF: You have any concerns or worries during your pregnancy. It is better to call with your questions if you feel they cannot wait, rather than worry about them. DECISIONS ABOUT CIRCUMCISION You may or may not know the sex of your baby. If you know your baby is a boy, it may be time to think about circumcision. Circumcision is the removal of the foreskin of the penis. This is the skin that covers the sensitive end of the penis.  There is no proven medical need for this. Often this decision is made on what is popular at the time or based upon religious beliefs and social issues. You can discuss these issues with your caregiver or pediatrician. SEEK IMMEDIATE MEDICAL CARE IF:   An unexplained oral temperature above 102 F (38.9 C) develops, or as your caregiver suggests.  You have leaking of fluid from the vagina (birth canal). If leaking membranes are suspected, take your temperature and tell your caregiver of this when you call.  There is vaginal spotting, bleeding or passing clots. Tell your caregiver of the amount and how many pads are used.  You develop a bad smelling vaginal discharge with a change in the color from clear to white.  You develop vomiting that lasts more than 24 hours.  You develop chills or fever.  You develop shortness of breath.  You develop burning on urination.  You loose more than 2 pounds of weight or gain more than 2 pounds of weight or as suggested by your caregiver.  You notice sudden swelling of your face, hands, and feet or legs.  You develop belly (abdominal) pain. Round ligament discomfort is a common non-cancerous (benign) cause of abdominal pain in pregnancy. Your caregiver still must evaluate you.  You develop a severe headache that does not go away.  You develop visual problems, blurred or double vision.  If you have not felt your baby move for more than 1 hour. If you think the baby is not moving as much as usual, eat something with sugar in it and lie down on your left side for an hour. The baby should move at least 4 to 5 times per hour. Call right away if your baby moves less than that.  You fall, are in a car accident or any kind of trauma.  There is mental or physical violence at home. Document Released: 08/21/2001 Document Revised: 11/19/2011 Document Reviewed: 02/23/2009 [facility] Patient Information 2013 [place], [place].

## 2012-09-05 ENCOUNTER — Encounter: Payer: Self-pay | Admitting: Family Medicine

## 2012-09-10 NOTE — L&D Delivery Note (Signed)
I was present for the delivery and agree with above. Large gush of blood soon after delivery of infant followed by quick delivery of placenta. Bleeding decreased immediately. Fundus firm. Cytotec given due to bleeding episode and Hx PPH  W/ previous birth.   [place], [place] 11/18/2012 7:14 PM

## 2012-09-10 NOTE — L&D Delivery Note (Signed)
Delivery Note At 5:59 PM a viable and healthy female was delivered via Vaginal, Spontaneous Delivery (Presentation: Left Occiput Anterior).  APGAR: 8, 9; weight 8 lb 4.6 oz (3759 g).   Placenta status: Intact, Spontaneous.  Cord: 3 vessels with the following complications: None.  Cord pH: n/a  Anesthesia: Epidural  Episiotomy: None Lacerations: None Suture Repair: N/a Est. Blood Loss (mL): 450  Mom to postpartum.  Baby to nursery-stable.  Kevin Fenton 11/18/2012, 6:55 PM

## 2012-09-12 ENCOUNTER — Ambulatory Visit (HOSPITAL_COMMUNITY)
Admission: RE | Admit: 2012-09-12 | Discharge: 2012-09-12 | Disposition: A | Discharge status: Home / Self Care | Payer: Managed Care, Other (non HMO) | Source: Ambulatory Visit | Attending: Family Medicine | Admitting: Family Medicine

## 2012-09-12 DIAGNOSIS — O0993 Supervision of high risk pregnancy, unspecified, third trimester: Secondary | ICD-10-CM

## 2012-09-12 DIAGNOSIS — O09299 Supervision of pregnancy with other poor reproductive or obstetric history, unspecified trimester: Secondary | ICD-10-CM

## 2012-09-18 ENCOUNTER — Ambulatory Visit (INDEPENDENT_AMBULATORY_CARE_PROVIDER_SITE_OTHER): Payer: Medicaid Other | Admitting: Advanced Practice Midwife

## 2012-09-18 ENCOUNTER — Ambulatory Visit (HOSPITAL_COMMUNITY)
Admission: RE | Admit: 2012-09-18 | Discharge: 2012-09-18 | Disposition: A | Discharge status: Home / Self Care | Payer: Managed Care, Other (non HMO) | Source: Ambulatory Visit | Attending: Family Medicine | Admitting: Family Medicine

## 2012-09-18 ENCOUNTER — Other Ambulatory Visit: Payer: Medicaid Other

## 2012-09-18 VITALS — BP 95/62 | Temp 98.1°F | Wt 174.9 lb

## 2012-09-18 DIAGNOSIS — O09899 Supervision of other high risk pregnancies, unspecified trimester: Secondary | ICD-10-CM

## 2012-09-18 DIAGNOSIS — O09299 Supervision of pregnancy with other poor reproductive or obstetric history, unspecified trimester: Secondary | ICD-10-CM

## 2012-09-18 DIAGNOSIS — O3440 Maternal care for other abnormalities of cervix, unspecified trimester: Secondary | ICD-10-CM

## 2012-09-18 NOTE — Patient Instructions (Signed)
Preventing Preterm Labor Preterm labor is when a pregnant woman has contractions that cause the cervix to open, shorten, and thin before 37 weeks of pregnancy. You will have regular contractions (tightening) 2 to 3 minutes apart. This usually causes discomfort or pain. HOME CARE  Eat a healthy diet.  Take your vitamins as told by your doctor.  Drink enough fluids to keep your pee (urine) clear or pale yellow every day.  Get rest and sleep.  Do not have sex if you are at high risk for preterm labor.  Follow your doctor's advice about activity, medicines, and tests.  Avoid stress.  Avoid hard labor or exercise that lasts for a long time.  Do not smoke. GET HELP RIGHT AWAY IF:   You are having contractions.  You have belly (abdominal) pain.  You have bleeding from your vagina.  You have pain when you pee (urinate).  You have abnormal discharge from your vagina.  You have a temperature by mouth above 102 F (38.9 C). MAKE SURE YOU:  Understand these instructions.  Will watch your condition.  Will get help if you are not doing well or get worse. Document Released: 11/23/2008 Document Revised: 11/19/2011 Document Reviewed: 11/23/2008 [employer] Patient Information 2013 [employer].  

## 2012-09-18 NOTE — Progress Notes (Signed)
C/o continued low abd, pelvic and back pressure, constant, but worse w/ walking and turning over in bed at night. Unsure if having UC's. Unable to void. No Urinary complaints. Cervix long and closed. Rec maternity belt, warm baths. BPP 09/12/12 8/8. BPP today. Growth [place] scheduled 10/01/12.

## 2012-09-18 NOTE — Progress Notes (Signed)
Pt. Complains of severe pain and pressure in her lower abdomen.  It is interfering with her being able to work.  "Sometimes the pain is so bad I can't hardly walk."  Pt. Went into labor early with her 1st child and they were able to stop labor and he was born around 57 weeks.  Pt. Is unsure if what she is feeling is real contractions or braxton hicks.   p=95

## 2012-09-30 ENCOUNTER — Encounter: Payer: Self-pay | Admitting: *Deleted

## 2012-09-30 ENCOUNTER — Encounter (HOSPITAL_COMMUNITY): Payer: Self-pay | Admitting: *Deleted

## 2012-09-30 ENCOUNTER — Inpatient Hospital Stay (HOSPITAL_COMMUNITY)
Admission: AD | Admit: 2012-09-30 | Discharge: 2012-09-30 | Disposition: A | Discharge status: Home / Self Care | Payer: Managed Care, Other (non HMO) | Source: Ambulatory Visit | Attending: Obstetrics and Gynecology | Admitting: Obstetrics and Gynecology

## 2012-09-30 ENCOUNTER — Ambulatory Visit (INDEPENDENT_AMBULATORY_CARE_PROVIDER_SITE_OTHER): Payer: Medicaid Other | Admitting: Obstetrics & Gynecology

## 2012-09-30 VITALS — BP 110/64 | Temp 97.4°F | Wt 178.8 lb

## 2012-09-30 DIAGNOSIS — O3430 Maternal care for cervical incompetence, unspecified trimester: Secondary | ICD-10-CM

## 2012-09-30 DIAGNOSIS — O4700 False labor before 37 completed weeks of gestation, unspecified trimester: Secondary | ICD-10-CM | POA: Insufficient documentation

## 2012-09-30 LAB — POCT URINALYSIS DIP (DEVICE)
Leukocytes, UA: NEGATIVE
Nitrite: NEGATIVE
Protein, ur: NEGATIVE mg/dL
Urobilinogen, UA: 0.2 mg/dL (ref 0.0–1.0)

## 2012-09-30 MED ORDER — NIFEDIPINE 10 MG PO CAPS: 10.0000 mg | ORAL_CAPSULE | Freq: Four times a day (QID) | ORAL | Status: DC | PRN

## 2012-09-30 MED ORDER — BETAMETHASONE SOD PHOS & ACET 6 (3-3) MG/ML IJ SUSP
12.0000 mg | Freq: Once | INTRAMUSCULAR | Status: AC
  Administered 2012-09-30: 12 mg via INTRAMUSCULAR
  Filled 2012-09-30: qty 2

## 2012-09-30 MED ORDER — LACTATED RINGERS IV BOLUS (SEPSIS)
1000.0000 mL | Freq: Once | INTRAVENOUS | Status: AC
  Administered 2012-09-30: 1000 mL via INTRAVENOUS

## 2012-09-30 MED ORDER — NIFEDIPINE 10 MG PO CAPS
10.0000 mg | ORAL_CAPSULE | ORAL | Status: DC | PRN
  Administered 2012-09-30 (×2): 10 mg via ORAL
  Filled 2012-09-30 (×2): qty 1

## 2012-09-30 NOTE — Progress Notes (Signed)
Pulse- 97  Edema-feet  Pressure- "lots of pelvic pressure"

## 2012-09-30 NOTE — Progress Notes (Signed)
Dr Paulina Fusi notified of pt's admission to MAU from clinic due to PTL concerns.

## 2012-09-30 NOTE — Progress Notes (Signed)
Call to Dr Paulina Fusi -pt requesting to eat. OK to eat and provider on way down to see pt

## 2012-09-30 NOTE — MAU Note (Signed)
Pt sent to MAU from clinic due to ctxs and cerival changes

## 2012-09-30 NOTE — Progress Notes (Signed)
Dr Paulina Fusi notified pt's IVFs almost in, pt feeling better, had 2 doses Procardia and Bethamethasone.

## 2012-09-30 NOTE — Progress Notes (Signed)
Crackers and juice to pt

## 2012-09-30 NOTE — MAU Provider Note (Signed)
Chief Complaint:  Contractions .mau  HPI: [name] is a 32 y.o. [phone] at [redacted]w[redacted]d who presents to maternity admissions after being seen in clinic for preterm labor.  Pt has a history of 26 week IUFD (no PTL) SAB and one  Term birth w/ PTL and is being followed by [facility] for Hx IUFD.  Today she presented to clinic for abdominal pressure that has been ongoing for the day.  Pt was placed on the monitor and was noted to be having irregular contractions and she was checked and noted to be 1.5/30/-3.  She was sent up to MAU for tocolysis, monitoring continuous FHT, and Betamethasone shot.    Denies leakage of fluid or vaginal bleeding. Good fetal movement. Does state she had some mucoid discharge around two weeks ago that she thought was her mucous plug.  Otherwise denies fever, chills, sweats, worsening lower extremity edema.    Pregnancy Course:   Past Medical History: Past Medical History  Diagnosis Date  . Abnormal Pap smear     Past obstetric history: OB History    Grav Para Term Preterm Abortions TAB SAB Ect Mult Living   5 2 1 1 2  2   1      # Outc Date GA Lbr Len/2nd Wgt Sex Del Anes PTL Lv   1 PRE 12/03 [redacted]w[redacted]d  2lb(0.907kg) M SVD None Yes SB   2 SAB 3/05 [redacted]w[redacted]d          3 TRM 6/06 [redacted]w[redacted]d  7lb15oz(3.6kg) M SVD EPI Yes Yes   4 SAB 2/09 [redacted]w[redacted]d          5 CUR               Past Surgical History: Past Surgical History  Procedure Date  . Cervix lesion destruction     Family History: Family History  Problem Relation Age of Onset  . Hypertension Mother   . Mental illness Mother     anxiety  . Hypertension Maternal Grandmother   . Hypertension Maternal Grandfather     Social History: History  Substance Use Topics  . Smoking status: Former Smoker -- 0.5 packs/day for 12 years    Types: Cigarettes  . Smokeless tobacco: Never Used  . Alcohol Use: Yes     Comment: socially    Allergies: No Known Allergies  Meds:  Prescriptions prior to admission  Medication Sig  Dispense Refill  . acetaminophen (TYLENOL) 325 MG tablet Take 650 mg by mouth every 6 (six) hours as needed. For pain      . cyclobenzaprine (FLEXERIL) 10 MG tablet Take 1 tablet (10 mg total) by mouth at bedtime as needed for muscle spasms.  12 tablet  1  . prenatal vitamin w/FE, FA (PRENATAL 1 + 1) 27-1 MG TABS Take 1 tablet by mouth daily.        ROS: Pertinent findings in history of present illness.  Physical Exam  Blood pressure 114/69, pulse 104, temperature 97 F (36.1 C), temperature source Oral, resp. rate 18, last menstrual period 02/19/2012. GENERAL: Well-developed, well-nourished female in no acute distress.  HEENT: [place]/AT  HEART: RRR RESP: CTAB ABDOMEN: Soft, non-tender, gravid appropriate for gestational age EXTREMITIES: Nontender, no edema NEURO: alert and oriented SPECULUM EXAM: Deferred     FHT:  Baseline 140 , moderate variability, accelerations present, no decelerations Contractions: q 5-10 mins   Labs: Results for orders placed in visit on 09/30/12 (from the past 24 hour(s))  POCT URINALYSIS DIP (DEVICE)  Status: Abnormal   Collection Time   09/30/12 10:31 AM      Component Value Range   Glucose, UA NEGATIVE  NEGATIVE mg/dL   Bilirubin Urine NEGATIVE  NEGATIVE   Ketones, ur NEGATIVE  NEGATIVE mg/dL   Specific Gravity, Urine 1.020  1.005 - 1.030   Hgb urine dipstick NEGATIVE  NEGATIVE   pH 8.5 (*) 5.0 - 8.0   Protein, ur NEGATIVE  NEGATIVE mg/dL   Urobilinogen, UA 0.2  0.0 - 1.0 mg/dL   Nitrite NEGATIVE  NEGATIVE   Leukocytes, UA NEGATIVE  NEGATIVE    Imaging:  NA  MAU Course: Will keep on monitor. Betamethasone 12 mg x1.  Will need repeat dose in 24 hrs Procardia 10 mg q 20 minutes for tocosytole.  Max 4 doses Bolus LR.  Will recheck in one hour and if no change will d/c home with close f/u and 2 x per week NST  1520: Care assumed by Ivonne Andrew, CNM. No cervical change. Contractions decreased w/ Procardia x 2 doses and IV  fluids.  Assessment: 1. Preterm labor   2. Premature cervical dilation    Plan:  Per consult w/ Dr. Jolayne Panther will D/C home on Procardia x 2 weeks and have pt return for BMZ #2 in 24 hours.  Preterm labor precautions. Pelvic rest x 1 week. Follow-up Information    Please follow up. (Keep your appointment for Wednesday 09/30/12 )           Medication List     As of 09/30/2012  3:33 PM    ASK your doctor about these medications         acetaminophen 325 MG tablet   Commonly known as: TYLENOL      cyclobenzaprine 10 MG tablet   Commonly known as: FLEXERIL   Take 1 tablet (10 mg total) by mouth at bedtime as needed for muscle spasms.      prenatal vitamin w/FE, FA 27-1 MG Tabs         Briscoe Deutscher, DO 09/30/2012 1:17 PM  Dorathy Kinsman, CNM 09/30/2012 3:33 PM

## 2012-09-30 NOTE — Progress Notes (Signed)
Feels more pressure, only relief is when lying on her side. Occasional contractions. Wants to go on FMLA. Start twice weekly NST. NR tracing and irregular UC, to MAU for monitoring and start course of betamethasone

## 2012-09-30 NOTE — Patient Instructions (Addendum)

## 2012-10-01 ENCOUNTER — Ambulatory Visit (HOSPITAL_COMMUNITY): Admission: RE | Admit: 2012-10-01 | Payer: Medicaid Other | Source: Ambulatory Visit

## 2012-10-01 ENCOUNTER — Inpatient Hospital Stay (HOSPITAL_COMMUNITY)
Admission: AD | Admit: 2012-10-01 | Discharge: 2012-10-01 | Disposition: A | Discharge status: Home / Self Care | Payer: Managed Care, Other (non HMO) | Source: Ambulatory Visit | Attending: Obstetrics & Gynecology | Admitting: Obstetrics & Gynecology

## 2012-10-01 DIAGNOSIS — O4700 False labor before 37 completed weeks of gestation, unspecified trimester: Secondary | ICD-10-CM | POA: Insufficient documentation

## 2012-10-01 MED ORDER — BETAMETHASONE SOD PHOS & ACET 6 (3-3) MG/ML IJ SUSP
12.0000 mg | Freq: Once | INTRAMUSCULAR | Status: AC
  Administered 2012-10-01: 12 mg via INTRAMUSCULAR
  Filled 2012-10-01: qty 2

## 2012-10-01 NOTE — MAU Provider Note (Signed)
Attestation of Attending Supervision of Advanced Practitioner (CNM/NP): Evaluation and management procedures were performed by the Advanced Practitioner under my supervision and collaboration.  I have reviewed the Advanced Practitioner's note and chart, and I agree with the management and plan.  Ayala Ribble 10/01/2012 9:37 AM

## 2012-10-02 ENCOUNTER — Encounter: Payer: Medicaid Other | Admitting: Family Medicine

## 2012-10-03 ENCOUNTER — Ambulatory Visit (INDEPENDENT_AMBULATORY_CARE_PROVIDER_SITE_OTHER): Payer: Medicaid Other | Admitting: *Deleted

## 2012-10-03 ENCOUNTER — Encounter: Payer: Self-pay | Admitting: *Deleted

## 2012-10-03 VITALS — BP 110/63 | Wt 178.4 lb

## 2012-10-03 DIAGNOSIS — O09299 Supervision of pregnancy with other poor reproductive or obstetric history, unspecified trimester: Secondary | ICD-10-CM

## 2012-10-03 NOTE — Progress Notes (Signed)
P = 114  Pt states she only takes the procardia once daily because it makes her dizzy

## 2012-10-07 ENCOUNTER — Ambulatory Visit (INDEPENDENT_AMBULATORY_CARE_PROVIDER_SITE_OTHER): Payer: Medicaid Other | Admitting: Obstetrics & Gynecology

## 2012-10-07 VITALS — BP 101/60 | Wt 180.8 lb

## 2012-10-07 DIAGNOSIS — O09299 Supervision of pregnancy with other poor reproductive or obstetric history, unspecified trimester: Secondary | ICD-10-CM

## 2012-10-07 DIAGNOSIS — O3430 Maternal care for cervical incompetence, unspecified trimester: Secondary | ICD-10-CM

## 2012-10-07 DIAGNOSIS — O3440 Maternal care for other abnormalities of cervix, unspecified trimester: Secondary | ICD-10-CM

## 2012-10-07 DIAGNOSIS — O99330 Smoking (tobacco) complicating pregnancy, unspecified trimester: Secondary | ICD-10-CM

## 2012-10-07 LAB — POCT URINALYSIS DIP (DEVICE)
Glucose, UA: NEGATIVE mg/dL
Nitrite: NEGATIVE
Urobilinogen, UA: 0.2 mg/dL (ref 0.0–1.0)

## 2012-10-07 NOTE — Patient Instructions (Addendum)
Pregnancy - Third Trimester  The third trimester of pregnancy (the last 3 months) is a period of the most rapid growth for you and your baby. The baby approaches a length of 20 inches and a weight of 6 to 10 pounds. The baby is adding on fat and getting ready for life outside your body. While inside, babies have periods of sleeping and waking, suck their thumbs, and hiccups. You can often feel small contractions of the uterus. This is false labor. It is also called Braxton-Hicks contractions. This is like a practice for labor. The usual problems in this stage of pregnancy include more difficulty breathing, swelling of the hands and feet from water retention, and having to urinate more often because of the uterus and baby pressing on your bladder.   PRENATAL EXAMS  · Blood work may continue to be done during prenatal exams. These tests are done to check on your health and the probable health of your baby. Blood work is used to follow your blood levels (hemoglobin). Anemia (low hemoglobin) is common during pregnancy. Iron and vitamins are given to help prevent this. You may also continue to be checked for diabetes. Some of the past blood tests may be done again.  · The size of the uterus is measured during each visit. This makes sure your baby is growing properly according to your pregnancy dates.  · Your blood pressure is checked every prenatal visit. This is to make sure you are not getting toxemia.  · Your urine is checked every prenatal visit for infection, diabetes and protein.  · Your weight is checked at each visit. This is done to make sure gains are happening at the suggested rate and that you and your baby are growing normally.  · Sometimes, an ultrasound is performed to confirm the position and the proper growth and development of the baby. This is a test done that bounces harmless sound waves off the baby so your caregiver can more accurately determine due dates.  · Discuss the type of pain medication and  anesthesia you will have during your labor and delivery.  · Discuss the possibility and anesthesia if a Cesarean Section might be necessary.  · Inform your caregiver if there is any mental or physical violence at home.  Sometimes, a specialized non-stress test, contraction stress test and biophysical profile are done to make sure the baby is not having a problem. Checking the amniotic fluid surrounding the baby is called an amniocentesis. The amniotic fluid is removed by sticking a needle into the belly (abdomen). This is sometimes done near the end of pregnancy if an early delivery is required. In this case, it is done to help make sure the baby's lungs are mature enough for the baby to live outside of the womb. If the lungs are not mature and it is unsafe to deliver the baby, an injection of cortisone medication is given to the mother 1 to 2 days before the delivery. This helps the baby's lungs mature and makes it safer to deliver the baby.  CHANGES OCCURING IN THE THIRD TRIMESTER OF PREGNANCY  Your body goes through many changes during pregnancy. They vary from person to person. Talk to your caregiver about changes you notice and are concerned about.  · During the last trimester, you have probably had an increase in your appetite. It is normal to have cravings for certain foods. This varies from person to person and pregnancy to pregnancy.  · You may begin to   get stretch marks on your hips, abdomen, and breasts. These are normal changes in the body during pregnancy. There are no exercises or medications to take which prevent this change.  · Constipation may be treated with a stool softener or adding bulk to your diet. Drinking lots of fluids, fiber in vegetables, fruits, and whole grains are helpful.  · Exercising is also helpful. If you have been very active up until your pregnancy, most of these activities can be continued during your pregnancy. If you have been less active, it is helpful to start an exercise  program such as walking. Consult your caregiver before starting exercise programs.  · Avoid all smoking, alcohol, un-prescribed drugs, herbs and "street drugs" during your pregnancy. These chemicals affect the formation and growth of the baby. Avoid chemicals throughout the pregnancy to ensure the delivery of a healthy infant.  · Backache, varicose veins and hemorrhoids may develop or get worse.  · You will tire more easily in the third trimester, which is normal.  · The baby's movements may be stronger and more often.  · You may become short of breath easily.  · Your belly button may stick out.  · A yellow discharge may leak from your breasts called colostrum.  · You may have a bloody mucus discharge. This usually occurs a few days to a week before labor begins.  HOME CARE INSTRUCTIONS   · Keep your caregiver's appointments. Follow your caregiver's instructions regarding medication use, exercise, and diet.  · During pregnancy, you are providing food for you and your baby. Continue to eat regular, well-balanced meals. Choose foods such as meat, fish, milk and other low fat dairy products, vegetables, fruits, and whole-grain breads and cereals. Your caregiver will tell you of the ideal weight gain.  · A physical sexual relationship may be continued throughout pregnancy if there are no other problems such as early (premature) leaking of amniotic fluid from the membranes, vaginal bleeding, or belly (abdominal) pain.  · Exercise regularly if there are no restrictions. Check with your caregiver if you are unsure of the safety of your exercises. Greater weight gain will occur in the last 2 trimesters of pregnancy. Exercising helps:  · Control your weight.  · Get you in shape for labor and delivery.  · You lose weight after you deliver.  · Rest a lot with legs elevated, or as needed for leg cramps or low back pain.  · Wear a good support or jogging bra for breast tenderness during pregnancy. This may help if worn during  sleep. Pads or tissues may be used in the bra if you are leaking colostrum.  · Do not use hot tubs, steam rooms, or saunas.  · Wear your seat belt when driving. This protects you and your baby if you are in an accident.  · Avoid raw meat, cat litter boxes and soil used by cats. These carry germs that can cause birth defects in the baby.  · It is easier to loose urine during pregnancy. Tightening up and strengthening the pelvic muscles will help with this problem. You can practice stopping your urination while you are going to the bathroom. These are the same muscles you need to strengthen. It is also the muscles you would use if you were trying to stop from passing gas. You can practice tightening these muscles up 10 times a set and repeating this about 3 times per day. Once you know what muscles to tighten up, do not perform these   exercises during urination. It is more likely to cause an infection by backing up the urine.  · Ask for help if you have financial, counseling or nutritional needs during pregnancy. Your caregiver will be able to offer counseling for these needs as well as refer you for other special needs.  · Make a list of emergency phone numbers and have them available.  · Plan on getting help from family or friends when you go home from the hospital.  · Make a trial run to the hospital.  · Take prenatal classes with the father to understand, practice and ask questions about the labor and delivery.  · Prepare the baby's room/nursery.  · Do not travel out of the city unless it is absolutely necessary and with the advice of your caregiver.  · Wear only low or no heal shoes to have better balance and prevent falling.  MEDICATIONS AND DRUG USE IN PREGNANCY  · Take prenatal vitamins as directed. The vitamin should contain 1 milligram of folic acid. Keep all vitamins out of reach of children. Only a couple vitamins or tablets containing iron may be fatal to a baby or young child when ingested.  · Avoid use  of all medications, including herbs, over-the-counter medications, not prescribed or suggested by your caregiver. Only take over-the-counter or prescription medicines for pain, discomfort, or fever as directed by your caregiver. Do not use aspirin, ibuprofen (Motrin®, Advil®, Nuprin®) or naproxen (Aleve®) unless OK'd by your caregiver.  · Let your caregiver also know about herbs you may be using.  · Alcohol is related to a number of birth defects. This includes fetal alcohol syndrome. All alcohol, in any form, should be avoided completely. Smoking will cause low birth rate and premature babies.  · Street/illegal drugs are very harmful to the baby. They are absolutely forbidden. A baby born to an addicted mother will be addicted at birth. The baby will go through the same withdrawal an adult does.  SEEK MEDICAL CARE IF:  You have any concerns or worries during your pregnancy. It is better to call with your questions if you feel they cannot wait, rather than worry about them.  DECISIONS ABOUT CIRCUMCISION  You may or may not know the sex of your baby. If you know your baby is a boy, it may be time to think about circumcision. Circumcision is the removal of the foreskin of the penis. This is the skin that covers the sensitive end of the penis. There is no proven medical need for this. Often this decision is made on what is popular at the time or based upon religious beliefs and social issues. You can discuss these issues with your caregiver or pediatrician.  SEEK IMMEDIATE MEDICAL CARE IF:   · An unexplained oral temperature above 102° F (38.9° C) develops, or as your caregiver suggests.  · You have leaking of fluid from the vagina (birth canal). If leaking membranes are suspected, take your temperature and tell your caregiver of this when you call.  · There is vaginal spotting, bleeding or passing clots. Tell your caregiver of the amount and how many pads are used.  · You develop a bad smelling vaginal discharge with  a change in the color from clear to white.  · You develop vomiting that lasts more than 24 hours.  · You develop chills or fever.  · You develop shortness of breath.  · You develop burning on urination.  · You loose more than 2 pounds of weight   or gain more than 2 pounds of weight or as suggested by your caregiver.  · You notice sudden swelling of your face, hands, and feet or legs.  · You develop belly (abdominal) pain. Round ligament discomfort is a common non-cancerous (benign) cause of abdominal pain in pregnancy. Your caregiver still must evaluate you.  · You develop a severe headache that does not go away.  · You develop visual problems, blurred or double vision.  · If you have not felt your baby move for more than 1 hour. If you think the baby is not moving as much as usual, eat something with sugar in it and lie down on your left side for an hour. The baby should move at least 4 to 5 times per hour. Call right away if your baby moves less than that.  · You fall, are in a car accident or any kind of trauma.  · There is mental or physical violence at home.  Document Released: 08/21/2001 Document Revised: 11/19/2011 Document Reviewed: 02/23/2009  [employer]® Patient Information ©2013 [employer].

## 2012-10-07 NOTE — Progress Notes (Signed)
P = 96     [place] growth and NST @ [facility] on 10/10/12.  Pt still taking Procardia once daily.  Pt reports leaking of clear fluid from vagina x3.yesterday-none today.

## 2012-10-07 NOTE — Progress Notes (Signed)
NST reviewed and reactive.  Pt c/o changing her underwear often 2-3 days prev.  She is unsure if she is ruptured.  Exam shows thick white d/c which appears to be physiologic.   Pt requests that FMLA papers be signed. She has h/o IUFD and has to come in bi-weekly for appts. Pt also has a h/o shortened cervix now with cervical dilation.  Will keep out of work for now until delivery.  Pt will bring in her FMLA paper when she comes in on firday for her NST/AFI.    Gracen Southwell L. Erin Fulling, M.D., FACOG  .

## 2012-10-10 ENCOUNTER — Ambulatory Visit (HOSPITAL_COMMUNITY)
Admit: 2012-10-10 | Discharge: 2012-10-10 | Disposition: A | Discharge status: Home / Self Care | Payer: Managed Care, Other (non HMO) | Attending: Obstetrics & Gynecology | Admitting: Obstetrics & Gynecology

## 2012-10-10 ENCOUNTER — Encounter (HOSPITAL_COMMUNITY): Payer: Self-pay

## 2012-10-10 ENCOUNTER — Other Ambulatory Visit: Payer: Self-pay | Admitting: Maternal and Fetal Medicine

## 2012-10-10 VITALS — BP 107/62 | HR 95 | Wt 180.5 lb

## 2012-10-10 DIAGNOSIS — D693 Immune thrombocytopenic purpura: Secondary | ICD-10-CM | POA: Insufficient documentation

## 2012-10-10 DIAGNOSIS — O09299 Supervision of pregnancy with other poor reproductive or obstetric history, unspecified trimester: Secondary | ICD-10-CM

## 2012-10-10 DIAGNOSIS — O3660X0 Maternal care for excessive fetal growth, unspecified trimester, not applicable or unspecified: Secondary | ICD-10-CM | POA: Insufficient documentation

## 2012-10-10 DIAGNOSIS — D689 Coagulation defect, unspecified: Secondary | ICD-10-CM | POA: Insufficient documentation

## 2012-10-13 ENCOUNTER — Ambulatory Visit (INDEPENDENT_AMBULATORY_CARE_PROVIDER_SITE_OTHER): Payer: Medicaid Other | Admitting: Obstetrics & Gynecology

## 2012-10-13 VITALS — BP 100/63 | Temp 97.1°F | Wt 176.9 lb

## 2012-10-13 DIAGNOSIS — O09299 Supervision of pregnancy with other poor reproductive or obstetric history, unspecified trimester: Secondary | ICD-10-CM

## 2012-10-13 NOTE — Progress Notes (Signed)
P=96.  Patient would like to have her iron checked today if possible.

## 2012-10-13 NOTE — Patient Instructions (Signed)

## 2012-10-13 NOTE — Progress Notes (Signed)
UC Q10 min, no ROM, bleeding  NST today reactive. [place] 10/11/12 55%ile and normal AFI.

## 2012-10-13 NOTE — Progress Notes (Signed)
Felt 3 uc's on monitor- states contractions feel stronger than they were

## 2012-10-15 ENCOUNTER — Other Ambulatory Visit: Payer: Self-pay | Admitting: Obstetrics & Gynecology

## 2012-10-15 DIAGNOSIS — O09299 Supervision of pregnancy with other poor reproductive or obstetric history, unspecified trimester: Secondary | ICD-10-CM

## 2012-10-17 ENCOUNTER — Ambulatory Visit (HOSPITAL_COMMUNITY)
Admission: RE | Admit: 2012-10-17 | Discharge: 2012-10-17 | Disposition: A | Discharge status: Home / Self Care | Payer: Managed Care, Other (non HMO) | Source: Ambulatory Visit | Attending: Obstetrics & Gynecology | Admitting: Obstetrics & Gynecology

## 2012-10-17 ENCOUNTER — Other Ambulatory Visit: Payer: Self-pay | Admitting: Obstetrics & Gynecology

## 2012-10-17 VITALS — BP 92/57 | HR 108 | Wt 179.5 lb

## 2012-10-17 DIAGNOSIS — O09299 Supervision of pregnancy with other poor reproductive or obstetric history, unspecified trimester: Secondary | ICD-10-CM | POA: Insufficient documentation

## 2012-10-17 DIAGNOSIS — O3660X0 Maternal care for excessive fetal growth, unspecified trimester, not applicable or unspecified: Secondary | ICD-10-CM

## 2012-10-17 DIAGNOSIS — D689 Coagulation defect, unspecified: Secondary | ICD-10-CM | POA: Insufficient documentation

## 2012-10-17 DIAGNOSIS — D693 Immune thrombocytopenic purpura: Secondary | ICD-10-CM | POA: Insufficient documentation

## 2012-10-17 DIAGNOSIS — O99119 Other diseases of the blood and blood-forming organs and certain disorders involving the immune mechanism complicating pregnancy, unspecified trimester: Secondary | ICD-10-CM | POA: Insufficient documentation

## 2012-10-17 NOTE — Progress Notes (Signed)
[name]  was seen today for an ultrasound appointment.  See full report in [facility].  Impression: Single IUP at 34 3/7 weeks Limited ultrasound performed for AFI only Normal amniotic fluid volume (12.7 cm) Reactive NST - normal modified BPP.  Recommendations: Continue antenatal testing (twice weekly NSTs with weekly AFIs or BPPs)  Alpha Gula, MD

## 2012-10-20 ENCOUNTER — Ambulatory Visit (HOSPITAL_COMMUNITY)
Admission: RE | Admit: 2012-10-20 | Discharge: 2012-10-20 | Disposition: A | Discharge status: Home / Self Care | Payer: Managed Care, Other (non HMO) | Source: Ambulatory Visit | Attending: Family Medicine | Admitting: Family Medicine

## 2012-10-20 ENCOUNTER — Other Ambulatory Visit: Payer: Self-pay | Admitting: Family Medicine

## 2012-10-20 ENCOUNTER — Ambulatory Visit (INDEPENDENT_AMBULATORY_CARE_PROVIDER_SITE_OTHER): Payer: Managed Care, Other (non HMO) | Admitting: Family Medicine

## 2012-10-20 ENCOUNTER — Ambulatory Visit (HOSPITAL_COMMUNITY): Payer: Managed Care, Other (non HMO)

## 2012-10-20 ENCOUNTER — Encounter (HOSPITAL_COMMUNITY): Payer: Self-pay | Admitting: *Deleted

## 2012-10-20 ENCOUNTER — Encounter: Payer: Self-pay | Admitting: Family Medicine

## 2012-10-20 ENCOUNTER — Inpatient Hospital Stay (HOSPITAL_COMMUNITY)
Admission: AD | Admit: 2012-10-20 | Discharge: 2012-10-21 | Disposition: A | Discharge status: Home / Self Care | Payer: Managed Care, Other (non HMO) | Source: Ambulatory Visit | Attending: Obstetrics & Gynecology | Admitting: Obstetrics & Gynecology

## 2012-10-20 VITALS — BP 108/73 | Temp 97.5°F | Wt 179.9 lb

## 2012-10-20 DIAGNOSIS — D693 Immune thrombocytopenic purpura: Secondary | ICD-10-CM | POA: Insufficient documentation

## 2012-10-20 DIAGNOSIS — O288 Other abnormal findings on antenatal screening of mother: Secondary | ICD-10-CM

## 2012-10-20 DIAGNOSIS — O99119 Other diseases of the blood and blood-forming organs and certain disorders involving the immune mechanism complicating pregnancy, unspecified trimester: Secondary | ICD-10-CM | POA: Insufficient documentation

## 2012-10-20 DIAGNOSIS — D689 Coagulation defect, unspecified: Secondary | ICD-10-CM | POA: Insufficient documentation

## 2012-10-20 DIAGNOSIS — O289 Unspecified abnormal findings on antenatal screening of mother: Secondary | ICD-10-CM | POA: Insufficient documentation

## 2012-10-20 DIAGNOSIS — O368190 Decreased fetal movements, unspecified trimester, not applicable or unspecified: Secondary | ICD-10-CM | POA: Insufficient documentation

## 2012-10-20 DIAGNOSIS — O09299 Supervision of pregnancy with other poor reproductive or obstetric history, unspecified trimester: Secondary | ICD-10-CM | POA: Insufficient documentation

## 2012-10-20 DIAGNOSIS — O368131 Decreased fetal movements, third trimester, fetus 1: Secondary | ICD-10-CM

## 2012-10-20 NOTE — Progress Notes (Signed)
FHR-140's minimal var, no true accels and some decels noted--for BPP Lost mucous plug BPP 8/8 in U/S

## 2012-10-20 NOTE — Progress Notes (Signed)
Pulse- 87 Patient reports occasional contractions and pelvic pressure. States "she lost part of her mucous plug this weekend"

## 2012-10-20 NOTE — MAU Note (Signed)
PT SAYS SHE HAD APPOINTMENT TODAY- AND SAID BABY WAS NOT REACTING GOOD- CAME HERE FOR U/S- PASSED TEST BUT NOT MOVING.    DID KICK CTS-  ONLY 3 MOVEMENTS.  [facility] CLINIC

## 2012-10-20 NOTE — Patient Instructions (Signed)
Breastfeeding Deciding to breastfeed is one of the best choices you can make for you and your baby. The information that follows gives a brief overview of the benefits of breastfeeding as well as common topics surrounding breastfeeding. BENEFITS OF BREASTFEEDING For the baby  The first milk (colostrum) helps the baby's digestive system function better.   There are antibodies in the mother's milk that help the baby fight off infections.   The baby has a lower incidence of asthma, allergies, and sudden infant death syndrome (SIDS).   The nutrients in breast milk are better for the baby than infant formulas, and breast milk helps the baby's brain grow better.   Babies who breastfeed have less gas, colic, and constipation.  For the mother  Breastfeeding helps develop a very special bond between the mother and her baby.   Breastfeeding is convenient, always available at the correct temperature, and costs nothing.   Breastfeeding burns calories in the mother and helps her lose weight that was gained during pregnancy.   Breastfeeding makes the uterus contract back down to normal size faster and slows bleeding following delivery.   Breastfeeding mothers have a lower risk of developing breast cancer.  BREASTFEEDING FREQUENCY  A healthy, full-term baby may breastfeed as often as every hour or space his or her feedings to every 3 hours.   Watch your baby for signs of hunger. Nurse your baby if he or she shows signs of hunger. How often you nurse will vary from baby to baby.   Nurse as often as the baby requests, or when you feel the need to reduce the fullness of your breasts.   Awaken the baby if it has been 3 4 hours since the last feeding.   Frequent feeding will help the mother make more milk and will help prevent problems, such as sore nipples and engorgement of the breasts.  BABY'S POSITION AT THE BREAST  Whether lying down or sitting, be sure that the baby's tummy is  facing your tummy.   Support the breast with 4 fingers underneath the breast and the thumb above. Make sure your fingers are well away from the nipple and baby's mouth.   Stroke the baby's lips gently with your finger or nipple.   When the baby's mouth is open wide enough, place all of your nipple and as much of the areola as possible into your baby's mouth.   Pull the baby in close so the tip of the nose and the baby's cheeks touch the breast during the feeding.  FEEDINGS AND SUCTION  The length of each feeding varies from baby to baby and from feeding to feeding.   The baby must suck about 2 3 minutes for your milk to get to him or her. This is called a "let down." For this reason, allow the baby to feed on each breast as long as he or she wants. Your baby will end the feeding when he or she has received the right balance of nutrients.   To break the suction, put your finger into the corner of the baby's mouth and slide it between his or her gums before removing your breast from his or her mouth. This will help prevent sore nipples.  HOW TO TELL WHETHER YOUR BABY IS GETTING ENOUGH BREAST MILK. Wondering whether or not your baby is getting enough milk is a common concern among mothers. You can be assured that your baby is getting enough milk if:   Your baby is actively   sucking and you hear swallowing.   Your baby seems relaxed and satisfied after a feeding.   Your baby nurses at least 8 12 times in a 24 hour time period. Nurse your baby until he or she unlatches or falls asleep at the first breast (at least 10 20 minutes), then offer the second side.   Your baby is wetting 5 6 disposable diapers (6 8 cloth diapers) in a 24 hour period by 5 6 days of age.   Your baby is having at least 3 4 stools every 24 hours for the first 6 weeks. The stool should be soft and yellow.   Your baby should gain 4 7 ounces per week after he or she is 4 days old.   Your breasts feel softer  after nursing.  REDUCING BREAST ENGORGEMENT  In the first week after your baby is born, you may experience signs of breast engorgement. When breasts are engorged, they feel heavy, warm, full, and may be tender to the touch. You can reduce engorgement if you:   Nurse frequently, every 2 3 hours. Mothers who breastfeed early and often have fewer problems with engorgement.   Place light ice packs on your breasts for 10 20 minutes between feedings. This reduces swelling. Wrap the ice packs in a lightweight towel to protect your skin. Bags of frozen vegetables work well for this purpose.   Take a warm shower or apply warm, moist heat to your breast for 5 10 minutes just before each feeding. This increases circulation and helps the milk flow.   Gently massage your breast before and during the feeding. Using your finger tips, massage from the chest wall towards your nipple in a circular motion.   Make sure that the baby empties at least one breast at every feeding before switching sides.   Use a breast pump to empty the breasts if your baby is sleepy or not nursing well. You may also want to pump if you are returning to work oryou feel you are getting engorged.   Avoid bottle feeds, pacifiers, or supplemental feedings of water or juice in place of breastfeeding. Breast milk is all the food your baby needs. It is not necessary for your baby to have water or formula. In fact, to help your breasts make more milk, it is best not to give your baby supplemental feedings during the early weeks.   Be sure the baby is latched on and positioned properly while breastfeeding.   Wear a supportive bra, avoiding underwire styles.   Eat a balanced diet with enough fluids.   Rest often, relax, and take your prenatal vitamins to prevent fatigue, stress, and anemia.  If you follow these suggestions, your engorgement should improve in 24 48 hours. If you are still experiencing difficulty, call your  lactation consultant or caregiver.  CARING FOR YOURSELF Take care of your breasts  Bathe or shower daily.   Avoid using soap on your nipples.   Start feedings on your left breast at one feeding and on your right breast at the next feeding.   You will notice an increase in your milk supply 2 5 days after delivery. You may feel some discomfort from engorgement, which makes your breasts very firm and often tender. Engorgement "peaks" out within 24 48 hours. In the meantime, apply warm moist towels to your breasts for 5 10 minutes before feeding. Gentle massage and expression of some milk before feeding will soften your breasts, making it easier for your   baby to latch on.   Wear a well-fitting nursing bra, and air dry your nipples for a 3 4minutes after each feeding.   Only use cotton bra pads.   Only use pure lanolin on your nipples after nursing. You do not need to wash it off before feeding the baby again. Another option is to express a few drops of breast milk and gently massage it into your nipples.  Take care of yourself  Eat well-balanced meals and nutritious snacks.   Drinking milk, fruit juice, and water to satisfy your thirst (about 8 glasses a day).   Get plenty of rest.  Avoid foods that you notice affect the baby in a bad way.  SEEK MEDICAL CARE IF:   You have difficulty with breastfeeding and need help.   You have a hard, red, sore area on your breast that is accompanied by a fever.   Your baby is too sleepy to eat well or is having trouble sleeping.   Your baby is wetting less than 6 diapers a day, by 5 days of age.   Your baby's skin or white part of his or her eyes is more yellow than it was in the hospital.   You feel depressed.  Document Released: 08/27/2005 Document Revised: 02/26/2012 Document Reviewed: 11/25/2011 [employer] Patient Information 2013 [employer].  

## 2012-10-20 NOTE — Progress Notes (Signed)
Pt felt UC's during NST as tightening and back pain

## 2012-10-20 NOTE — MAU Provider Note (Signed)
Chief Complaint:  No chief complaint on file.   First Provider Initiated Contact with Patient 10/20/12 2328     HPI: [name] is a 32 y.o. [id number] at [redacted]w[redacted]d who presents to maternity admissions reporting decreased FM since 10/17/12. No FM since clinic visit and BPP at noon today. Small amount of mvmt since arrival to MAU tonight. Hx 26 week IUFD, unknown cause. Very nervous.   Past Medical History: Past Medical History  Diagnosis Date  . Abnormal Pap smear     Past obstetric history: OB History   Grav Para Term Preterm Abortions TAB SAB Ect Mult Living   5 2 1 1 2  2   1      # Outc Date GA Lbr Len/2nd Wgt Sex Del Anes PTL Lv   1 PRE 12/03 [redacted]w[redacted]d  0.907kg(2lb) M SVD None Yes SB   2 SAB 3/05 [redacted]w[redacted]d          3 TRM 6/06 [redacted]w[redacted]d  3.6kg(7lb15oz) M SVD EPI Yes Yes   4 SAB 2/09 [redacted]w[redacted]d          5 CUR               Past Surgical History: Past Surgical History  Procedure Laterality Date  . Cervix lesion destruction      Family History: Family History  Problem Relation Age of Onset  . Hypertension Mother   . Mental illness Mother     anxiety  . Hypertension Maternal Grandmother   . Hypertension Maternal Grandfather     Social History: History  Substance Use Topics  . Smoking status: Former Smoker -- 0.50 packs/day for 12 years    Types: Cigarettes  . Smokeless tobacco: Never Used  . Alcohol Use: Yes     Comment: socially    Allergies: No Known Allergies  Meds:  Prescriptions prior to admission  Medication Sig Dispense Refill  . acetaminophen (TYLENOL) 325 MG tablet Take 650 mg by mouth every 6 (six) hours as needed. For pain      . NIFEdipine (PROCARDIA) 10 MG capsule Take 1 capsule (10 mg total) by mouth every 6 (six) hours as needed (more than 5 contractions per hour.).  60 capsule  0  . prenatal vitamin w/FE, FA (PRENATAL 1 + 1) 27-1 MG TABS Take 1 tablet by mouth daily.      . cyclobenzaprine (FLEXERIL) 10 MG tablet Take 1 tablet (10 mg total) by mouth at bedtime as  needed for muscle spasms.  12 tablet  1    ROS: Mild UC's. No change over past few weeks. Denies leakage of fluid or vaginal bleeding.  Physical Exam  Blood pressure 101/64, pulse 89, temperature 97.8 F (36.6 C), temperature source Oral, resp. rate 20, height 5\' 2"  (1.575 m), weight 82.214 kg (181 lb 4 oz), last menstrual period 02/19/2012. GENERAL: Well-developed, well-nourished female in no acute distress, nervous.  HEENT: normocephalic HEART: normal rate RESP: normal effort ABDOMEN: Soft, non-tender, gravid appropriate for gestational age EXTREMITIES: Nontender, no edema NEURO: alert and oriented SPECULUM EXAM: NEFG, physiologic discharge, no blood, cervix clean    FHT:  Baseline 145 , minimal-moderate variability, accelerations present, no decelerations. Improved variability and accels w/ position change and juice.  Contractions: irreg, mild contractions   Labs: No results found for this or any previous visit (from the past 24 hour(s)).  Imaging:  NA   MAU Course: FHR monitoring x 1 hour. Neg CST.   Assessment: 1. Prior pregnancy with fetal demise, antepartum  2. Decreased fetal movement, third trimester, fetus 1    Plan: Discharge home Preterm  labor precautions and fetal kick counts     Follow-up Information   Follow up with [facility] On 10/24/2012.   Contact information:   [address] [place] [address] [phone]      Follow up with [facility]ERN[facility] ADMISS[facility]. (As needed if symptoms worsen)    Contact information:   [address] [id number] [place] [address] [phone]       Medication List    TAKE these medications       acetaminophen 325 MG tablet  Commonly known as:  TYLENOL  Take 650 mg by mouth every 6 (six) hours as needed. For pain     cyclobenzaprine 10 MG tablet  Commonly known as:  FLEXERIL  Take 1 tablet (10 mg total) by mouth at bedtime as needed for muscle  spasms.     NIFEdipine 10 MG capsule  Commonly known as:  PROCARDIA  Take 1 capsule (10 mg total) by mouth every 6 (six) hours as needed (more than 5 contractions per hour.).     prenatal vitamin w/FE, FA 27-1 MG Tabs  Take 1 tablet by mouth daily.        [place], CNM 10/21/2012 12:29 AM

## 2012-10-21 DIAGNOSIS — O368190 Decreased fetal movements, unspecified trimester, not applicable or unspecified: Secondary | ICD-10-CM

## 2012-10-24 ENCOUNTER — Ambulatory Visit (HOSPITAL_COMMUNITY): Payer: Medicaid Other

## 2012-10-24 ENCOUNTER — Other Ambulatory Visit (HOSPITAL_COMMUNITY): Payer: Medicaid Other

## 2012-10-24 ENCOUNTER — Other Ambulatory Visit: Payer: Managed Care, Other (non HMO)

## 2012-10-25 ENCOUNTER — Other Ambulatory Visit: Payer: Self-pay

## 2012-10-26 ENCOUNTER — Inpatient Hospital Stay (HOSPITAL_COMMUNITY)
Admission: AD | Admit: 2012-10-26 | Discharge: 2012-10-27 | Disposition: A | Discharge status: Hospice | Payer: Managed Care, Other (non HMO) | Source: Ambulatory Visit | Attending: Obstetrics & Gynecology | Admitting: Obstetrics & Gynecology

## 2012-10-26 DIAGNOSIS — O4700 False labor before 37 completed weeks of gestation, unspecified trimester: Secondary | ICD-10-CM | POA: Insufficient documentation

## 2012-10-26 MED ORDER — LACTATED RINGERS IV SOLN
INTRAVENOUS | Status: DC
  Administered 2012-10-26: 22:00:00 via INTRAVENOUS

## 2012-10-26 NOTE — MAU Note (Signed)
Pt c/o contractions that were 3-4 min apart for the past hour.  Denies any bleeding or leaking

## 2012-10-26 NOTE — MAU Provider Note (Signed)
History     CSN: [id number]  Arrival date and time: 10/26/12 2022   None     No chief complaint on file.  HPI  OB History   Grav Para Term Preterm Abortions TAB SAB Ect Mult Living   5 2 1 1 2  2   1     32  year old [id number] @ [redacted]w[redacted]d per LMP who presents with uterine contractions. She notes they started several hours ago have been increasing in frequency and strength. She denies leakage of fluids or bleeding, but thinks she has lost some of her mucous plug already. She denies nausea, vomiting, headache, changes in vision and RUQ pain. She is currently follow in high risk clinic for a history of IUFD at [redacted] weeks gestation.    Past Medical History  Diagnosis Date  . Abnormal Pap smear     Past Surgical History  Procedure Laterality Date  . Cervix lesion destruction      Family History  Problem Relation Age of Onset  . Hypertension Mother   . Mental illness Mother     anxiety  . Hypertension Maternal Grandmother   . Hypertension Maternal Grandfather     History  Substance Use Topics  . Smoking status: Former Smoker -- 0.50 packs/day for 12 years    Types: Cigarettes  . Smokeless tobacco: Never Used  . Alcohol Use: Yes     Comment: socially    Allergies: No Known Allergies  Prescriptions prior to admission  Medication Sig Dispense Refill  . NIFEdipine (PROCARDIA) 10 MG capsule Take 1 capsule (10 mg total) by mouth every 6 (six) hours as needed (more than 5 contractions per hour.).  60 capsule  0  . prenatal vitamin w/FE, FA (PRENATAL 1 + 1) 27-1 MG TABS Take 1 tablet by mouth daily.      Marland Kitchen acetaminophen (TYLENOL) 325 MG tablet Take 650 mg by mouth every 6 (six) hours as needed. For pain      . cyclobenzaprine (FLEXERIL) 10 MG tablet Take 1 tablet (10 mg total) by mouth at bedtime as needed for muscle spasms.  12 tablet  1    Review of Systems  All other systems reviewed and are negative.   Physical Exam   Blood pressure 107/67, pulse 120, temperature 98.5  F (36.9 C), temperature source Oral, resp. rate 16, height 5\' 2"  (1.575 m), weight 81.194 kg (179 lb), last menstrual period 02/19/2012.  Physical Exam  Constitutional: She is oriented to person, place, and time. She appears well-developed.  HENT:  Head: Normocephalic and atraumatic.  Eyes: Conjunctivae are normal. Pupils are equal, round, and reactive to light.  Neck: Normal range of motion. Neck supple.  Cardiovascular: Normal rate, regular rhythm and normal heart sounds.   Respiratory: Effort normal and breath sounds normal.  GI: There is no tenderness.  Gravid  Musculoskeletal: Normal range of motion.  No pitting pedal edema bilaterally  Neurological: She is alert and oriented to person, place, and time. She has normal reflexes.    MAU Course  Procedures  MDM Dilation: 3 Effacement (%): 80 Station: -3 Presentation: Vertex Exam by:: Elie Confer RN  Dilation: 2.5 Effacement (%): 80 Cervical Position: Posterior Station: -3 Presentation: Vertex Exam by:: Mat Carne MD   Assessment and Plan  32 year old F [phone] @ [redacted]w[redacted]d by LMP who presents for evaluation of uterine contractions. While she was having consistent contractions on tocometer, she had no cervical change in > 2.5 hours with her membranes  in tact. The baby was healthy on fetal monitoring and the patient was appropriate for discharge with follow up in high risk clinic the next morning. Plan discussed with Ivonne Andrew, CNM.     Mat Carne 10/26/2012, 9:26 PM   I was present for the exam and agree with above.  [place], CNM 10/27/2012 1:06 AM

## 2012-10-27 ENCOUNTER — Ambulatory Visit (INDEPENDENT_AMBULATORY_CARE_PROVIDER_SITE_OTHER): Payer: Managed Care, Other (non HMO) | Admitting: Obstetrics & Gynecology

## 2012-10-27 VITALS — BP 105/64 | Wt 180.5 lb

## 2012-10-27 DIAGNOSIS — O3430 Maternal care for cervical incompetence, unspecified trimester: Secondary | ICD-10-CM

## 2012-10-27 DIAGNOSIS — O09299 Supervision of pregnancy with other poor reproductive or obstetric history, unspecified trimester: Secondary | ICD-10-CM

## 2012-10-27 DIAGNOSIS — O479 False labor, unspecified: Secondary | ICD-10-CM

## 2012-10-27 DIAGNOSIS — O0990 Supervision of high risk pregnancy, unspecified, unspecified trimester: Secondary | ICD-10-CM

## 2012-10-27 LAB — GROUP B STREP BY PCR: Group B strep by PCR: NEGATIVE

## 2012-10-27 NOTE — Progress Notes (Signed)
NST 10/03/12 reactive 

## 2012-10-27 NOTE — Progress Notes (Signed)
Seen in MAU last night with contractions, had no cervical change in over 2.5 hours of observation.  Reactive NST yesterday, does not need one today in clinic. Will return on 2/21 for NST and AFI as scheduled.  Rechecked cervix today, no change.  Fetal movement and labor precautions reviewed.

## 2012-10-27 NOTE — Patient Instructions (Signed)
Return to clinic for any obstetric concerns or go to MAU for evaluation  

## 2012-10-27 NOTE — Progress Notes (Signed)
Pulse: 96 cx checked on 2/16= 3/90/-2

## 2012-10-31 ENCOUNTER — Ambulatory Visit (HOSPITAL_COMMUNITY): Payer: Medicaid Other

## 2012-10-31 ENCOUNTER — Ambulatory Visit (INDEPENDENT_AMBULATORY_CARE_PROVIDER_SITE_OTHER): Payer: Managed Care, Other (non HMO) | Admitting: *Deleted

## 2012-10-31 VITALS — BP 103/62 | Wt 183.1 lb

## 2012-10-31 DIAGNOSIS — O09299 Supervision of pregnancy with other poor reproductive or obstetric history, unspecified trimester: Secondary | ICD-10-CM

## 2012-10-31 NOTE — Progress Notes (Signed)
P-89 

## 2012-10-31 NOTE — MAU Provider Note (Signed)
Attestation of Attending Supervision of Advanced Practitioner (CNM/NP): Evaluation and management procedures were performed by the Advanced Practitioner under my supervision and collaboration. I have reviewed the Advanced Practitioner's note and chart, and I agree with the management and plan.  Collie Wernick H. 5:47 PM

## 2012-10-31 NOTE — Progress Notes (Addendum)
NST performed today was reviewed and was found to be reactive.  AFI 12.3 cm. Continue recommended antenatal testing and prenatal care.

## 2012-11-03 ENCOUNTER — Encounter: Payer: Self-pay | Admitting: Obstetrics and Gynecology

## 2012-11-03 ENCOUNTER — Other Ambulatory Visit: Payer: Self-pay | Admitting: Obstetrics & Gynecology

## 2012-11-03 ENCOUNTER — Ambulatory Visit (INDEPENDENT_AMBULATORY_CARE_PROVIDER_SITE_OTHER): Payer: Managed Care, Other (non HMO) | Admitting: Obstetrics and Gynecology

## 2012-11-03 VITALS — BP 108/64 | Temp 96.8°F | Wt 184.2 lb

## 2012-11-03 DIAGNOSIS — O09299 Supervision of pregnancy with other poor reproductive or obstetric history, unspecified trimester: Secondary | ICD-10-CM

## 2012-11-03 DIAGNOSIS — O0993 Supervision of high risk pregnancy, unspecified, third trimester: Secondary | ICD-10-CM

## 2012-11-03 LAB — POCT URINALYSIS DIP (DEVICE)
Glucose, UA: NEGATIVE mg/dL
Nitrite: NEGATIVE
Urobilinogen, UA: 0.2 mg/dL (ref 0.0–1.0)

## 2012-11-03 NOTE — Progress Notes (Signed)
2/24 NST reviewed and reactive. Patient doing well without any complaints. FM/labor precautions discussed. Will schedule for induction of labor on 3/11

## 2012-11-03 NOTE — Progress Notes (Signed)
IOL scheduled 11/18/12 at 630 am.

## 2012-11-06 ENCOUNTER — Telehealth (HOSPITAL_COMMUNITY): Payer: Self-pay | Admitting: *Deleted

## 2012-11-06 ENCOUNTER — Encounter (HOSPITAL_COMMUNITY): Payer: Self-pay | Admitting: *Deleted

## 2012-11-06 NOTE — Telephone Encounter (Signed)
Preadmission screen  

## 2012-11-07 ENCOUNTER — Other Ambulatory Visit (HOSPITAL_COMMUNITY): Payer: Medicaid Other

## 2012-11-07 ENCOUNTER — Ambulatory Visit (HOSPITAL_COMMUNITY): Payer: Medicaid Other

## 2012-11-07 ENCOUNTER — Ambulatory Visit (INDEPENDENT_AMBULATORY_CARE_PROVIDER_SITE_OTHER): Payer: Managed Care, Other (non HMO) | Admitting: *Deleted

## 2012-11-07 DIAGNOSIS — O09299 Supervision of pregnancy with other poor reproductive or obstetric history, unspecified trimester: Secondary | ICD-10-CM

## 2012-11-10 ENCOUNTER — Other Ambulatory Visit: Payer: Self-pay | Admitting: Obstetrics and Gynecology

## 2012-11-10 ENCOUNTER — Ambulatory Visit (INDEPENDENT_AMBULATORY_CARE_PROVIDER_SITE_OTHER): Payer: Managed Care, Other (non HMO) | Admitting: Obstetrics and Gynecology

## 2012-11-10 ENCOUNTER — Encounter: Payer: Self-pay | Admitting: Obstetrics and Gynecology

## 2012-11-10 VITALS — BP 115/72 | Temp 98.4°F | Wt 184.3 lb

## 2012-11-10 DIAGNOSIS — O09299 Supervision of pregnancy with other poor reproductive or obstetric history, unspecified trimester: Secondary | ICD-10-CM

## 2012-11-10 DIAGNOSIS — O3430 Maternal care for cervical incompetence, unspecified trimester: Secondary | ICD-10-CM

## 2012-11-10 DIAGNOSIS — O09293 Supervision of pregnancy with other poor reproductive or obstetric history, third trimester: Secondary | ICD-10-CM

## 2012-11-10 DIAGNOSIS — O0993 Supervision of high risk pregnancy, unspecified, third trimester: Secondary | ICD-10-CM

## 2012-11-10 DIAGNOSIS — O3443 Maternal care for other abnormalities of cervix, third trimester: Secondary | ICD-10-CM

## 2012-11-10 DIAGNOSIS — Z8619 Personal history of other infectious and parasitic diseases: Secondary | ICD-10-CM

## 2012-11-10 DIAGNOSIS — O3440 Maternal care for other abnormalities of cervix, unspecified trimester: Secondary | ICD-10-CM

## 2012-11-10 DIAGNOSIS — O3433 Maternal care for cervical incompetence, third trimester: Secondary | ICD-10-CM

## 2012-11-10 LAB — POCT URINALYSIS DIP (DEVICE)
Bilirubin Urine: NEGATIVE
Glucose, UA: NEGATIVE mg/dL
Hgb urine dipstick: NEGATIVE
Nitrite: NEGATIVE
pH: 6.5 (ref 5.0–8.0)

## 2012-11-10 NOTE — Addendum Note (Signed)
Addended by: Gerome Apley on: 11/10/2012 02:36 PM   Modules accepted: Orders

## 2012-11-10 NOTE — Progress Notes (Signed)
NST reviewed and reactive. Fm/labor precautions reviewed. NST on Friday. IOL on 3/11

## 2012-11-10 NOTE — Progress Notes (Signed)
P=88, c/o increased edema in feet/ankles, c/o pelvic and pressure,

## 2012-11-12 NOTE — Progress Notes (Signed)
2/28- NST reviewed and reactive 

## 2012-11-13 ENCOUNTER — Inpatient Hospital Stay (HOSPITAL_COMMUNITY)
Admission: AD | Admit: 2012-11-13 | Discharge: 2012-11-13 | Disposition: A | Discharge status: Home / Self Care | Payer: Managed Care, Other (non HMO) | Source: Ambulatory Visit | Attending: Obstetrics & Gynecology | Admitting: Obstetrics & Gynecology

## 2012-11-13 ENCOUNTER — Encounter (HOSPITAL_COMMUNITY): Payer: Self-pay | Admitting: *Deleted

## 2012-11-13 DIAGNOSIS — O479 False labor, unspecified: Secondary | ICD-10-CM | POA: Insufficient documentation

## 2012-11-13 DIAGNOSIS — O4703 False labor before 37 completed weeks of gestation, third trimester: Secondary | ICD-10-CM

## 2012-11-13 DIAGNOSIS — O4700 False labor before 37 completed weeks of gestation, unspecified trimester: Secondary | ICD-10-CM

## 2012-11-13 MED ORDER — ZOLPIDEM TARTRATE ER 12.5 MG PO TBCR: 12.5000 mg | EXTENDED_RELEASE_TABLET | Freq: Every evening | ORAL | Status: DC | PRN

## 2012-11-13 MED ORDER — ZOLPIDEM TARTRATE 5 MG PO TABS
5.0000 mg | ORAL_TABLET | Freq: Once | ORAL | Status: AC
  Administered 2012-11-13: 5 mg via ORAL
  Filled 2012-11-13: qty 1

## 2012-11-13 NOTE — MAU Provider Note (Signed)
Attestation of Attending Supervision of Advanced Practitioner (CNM/NP): Evaluation and management procedures were performed by the Advanced Practitioner under my supervision and collaboration.  I have reviewed the Advanced Practitioner's note and chart, and I agree with the management and plan.  HARRAWAY-SMITH, Markasia Carrol 8:00 PM

## 2012-11-13 NOTE — MAU Note (Signed)
Pt states she has been having contractions since early this morning

## 2012-11-13 NOTE — MAU Provider Note (Signed)
1840 recheck after ambulation  Appears comfortable with UCs cx exam: posterior 4/80/-2, unchanged from my previous exam FHR 140, reactive  A/P: [id number] [redacted]w[redacted]d, not in established labor Hx SB at 24 wks and planned IOL in 5 days See AVS for pt ed; kick counts Follow-up Information   Follow up with [facility]. (Keep your appointment as scheduled)    Contact information:   [address] [place] [address] [phone]

## 2012-11-13 NOTE — H&P (Signed)
[name] is a 32 y.o. female presenting for evaluation of onset of labor Maternal Medical History:  Reason for admission: Contractions.  Nausea.  Contractions: Onset was 6-12 hours ago.   Frequency: regular.   Duration is approximately 1 minute.   Perceived severity is moderate.    Fetal activity: Perceived fetal activity is normal.   Last perceived fetal movement was within the past hour.    Prenatal complications: No bleeding, HIV or pre-eclampsia.   Prenatal Complications - Diabetes: none.    OB History   Grav Para Term Preterm Abortions TAB SAB Ect Mult Living   5 2 1 1 2  2   1      Past Medical History  Diagnosis Date  . Abnormal Pap smear    Past Surgical History  Procedure Laterality Date  . Cervix lesion destruction     Family History: family history includes Hypertension in her maternal grandfather, maternal grandmother, and mother and Mental illness in her mother. Social History:  reports that she quit smoking about 5 months ago. Her smoking use included Cigarettes. She has a 6 pack-year smoking history. She has never used smokeless tobacco. She reports that  drinks alcohol. She reports that she uses illicit drugs (Marijuana).   Prenatal Transfer Tool  Maternal Diabetes: No Genetic Screening: Normal Maternal Ultrasounds/Referrals: Normal Fetal Ultrasounds or other Referrals:  None Maternal Substance Abuse:  No Significant Maternal Medications:  None Significant Maternal Lab Results:  None Other Comments:  None  Review of Systems  Constitutional: Negative for fever and chills.  Eyes: Negative for blurred vision and double vision.  Respiratory: Negative for cough and wheezing.   Cardiovascular: Negative for chest pain and palpitations.  Gastrointestinal: Negative for heartburn, nausea, vomiting, abdominal pain and diarrhea.  Genitourinary: Negative for dysuria, urgency and frequency.  Musculoskeletal: Negative for myalgias and joint pain.  Skin:  Negative for itching and rash.  Neurological: Negative for dizziness, tingling and headaches.  Endo/Heme/Allergies: Does not bruise/bleed easily.  All other systems reviewed and are negative.      Blood pressure 114/71, pulse 88, temperature 98.2 F (36.8 C), temperature source Oral, resp. rate 16, last menstrual period 02/19/2012. Maternal Exam:  Uterine Assessment: Contraction strength is mild.  Contraction duration is 1 minute. Contraction frequency is regular.   Abdomen: Patient reports no abdominal tenderness. Introitus: not evaluated.   Cervix: not evaluated.   Fetal Exam Fetal Monitor Review: Mode: ultrasound.   Baseline rate: 145.  Variability: moderate (6-25 bpm).   Pattern: accelerations present.    Fetal State Assessment: Category I - tracings are normal.     Physical Exam  Constitutional: She is oriented to person, place, and time. She appears well-developed and well-nourished. No distress.  HENT:  Head: Normocephalic and atraumatic.  Eyes: EOM are normal. Pupils are equal, round, and reactive to light.  Neck: Normal range of motion. Neck supple.  Cardiovascular: Normal rate, regular rhythm, normal heart sounds and intact distal pulses.   Respiratory: Effort normal and breath sounds normal.  GI: Soft. Bowel sounds are normal.  Musculoskeletal: Normal range of motion.  Neurological: She is alert and oriented to person, place, and time. She has normal reflexes.  Skin: Skin is warm and dry.  Psychiatric: She has a normal mood and affect. Her behavior is normal. Thought content normal.    Prenatal labs: ABO, Rh: B/Positive/-- (09/04 0000) Antibody: Negative (09/04 0000) Rubella: Immune (09/04 0000) RPR: NON REAC (12/26 0947)  HBsAg: Negative (09/04 0000)  HIV:  Non-reactive (09/04 0000)  GBS: Negative (02/16 0000)   Assessment/Plan: 32 y/o [id number] at [redacted]w[redacted]d presenting with contractions for evaluation of labor  #False labor:  --no cervical change despite  ambulation -- nst no change of contraction pattern -- discussed with patient signs of labor and when to return for further evaluation -- d/c to home    Gregor Hams 11/13/2012, 4:03 PM    Evaluation and management procedures were performed by Resident physician under my supervision/collaboration. Chart reviewed, patient examined by me and I agree with management and plan.

## 2012-11-13 NOTE — MAU Note (Signed)
Resident informed.of pt's presence on the unit

## 2012-11-14 ENCOUNTER — Other Ambulatory Visit (HOSPITAL_COMMUNITY): Payer: Medicaid Other

## 2012-11-14 ENCOUNTER — Ambulatory Visit (HOSPITAL_COMMUNITY): Payer: Medicaid Other

## 2012-11-14 ENCOUNTER — Other Ambulatory Visit: Payer: Managed Care, Other (non HMO)

## 2012-11-17 ENCOUNTER — Ambulatory Visit (INDEPENDENT_AMBULATORY_CARE_PROVIDER_SITE_OTHER): Payer: Managed Care, Other (non HMO) | Admitting: *Deleted

## 2012-11-17 VITALS — BP 106/71 | Wt 186.9 lb

## 2012-11-17 DIAGNOSIS — O09299 Supervision of pregnancy with other poor reproductive or obstetric history, unspecified trimester: Secondary | ICD-10-CM

## 2012-11-17 NOTE — Progress Notes (Signed)
P = 86  IOL tomorrow @ 0630

## 2012-11-17 NOTE — H&P (Signed)
Attestation of Attending Supervision of Advanced Practitioner (CNM/NP): Evaluation and management procedures were performed by the Advanced Practitioner under my supervision and collaboration.  I have reviewed the Advanced Practitioner's note and chart, and I agree with the management and plan.  HARRAWAY-SMITH, CAROLYN 10:57 AM     

## 2012-11-18 ENCOUNTER — Inpatient Hospital Stay (HOSPITAL_COMMUNITY)
Admission: RE | Admit: 2012-11-18 | Discharge: 2012-11-20 | DRG: 775 | Disposition: A | Discharge status: Home / Self Care | Payer: Managed Care, Other (non HMO) | Source: Ambulatory Visit | Attending: Obstetrics and Gynecology | Admitting: Obstetrics and Gynecology

## 2012-11-18 ENCOUNTER — Encounter (HOSPITAL_COMMUNITY): Payer: Self-pay | Admitting: Anesthesiology

## 2012-11-18 ENCOUNTER — Encounter (HOSPITAL_COMMUNITY): Payer: Self-pay

## 2012-11-18 ENCOUNTER — Inpatient Hospital Stay (HOSPITAL_COMMUNITY): Payer: Managed Care, Other (non HMO) | Admitting: Anesthesiology

## 2012-11-18 DIAGNOSIS — O09299 Supervision of pregnancy with other poor reproductive or obstetric history, unspecified trimester: Secondary | ICD-10-CM

## 2012-11-18 DIAGNOSIS — O9989 Other specified diseases and conditions complicating pregnancy, childbirth and the puerperium: Secondary | ICD-10-CM

## 2012-11-18 DIAGNOSIS — R0602 Shortness of breath: Secondary | ICD-10-CM

## 2012-11-18 DIAGNOSIS — O99893 Other specified diseases and conditions complicating puerperium: Principal | ICD-10-CM | POA: Diagnosis not present

## 2012-11-18 LAB — CBC
Hemoglobin: 10.1 g/dL — ABNORMAL LOW (ref 12.0–15.0)
MCH: 29.2 pg (ref 26.0–34.0)
MCHC: 33.2 g/dL (ref 30.0–36.0)
Platelets: 160 10*3/uL (ref 150–400)

## 2012-11-18 LAB — RPR: RPR Ser Ql: NONREACTIVE

## 2012-11-18 MED ORDER — PHENYLEPHRINE 40 MCG/ML (10ML) SYRINGE FOR IV PUSH (FOR BLOOD PRESSURE SUPPORT): 80.0000 ug | PREFILLED_SYRINGE | INTRAVENOUS | Status: DC | PRN

## 2012-11-18 MED ORDER — EPHEDRINE 5 MG/ML INJ
10.0000 mg | INTRAVENOUS | Status: DC | PRN
  Filled 2012-11-18: qty 4

## 2012-11-18 MED ORDER — TETANUS-DIPHTH-ACELL PERTUSSIS 5-2.5-18.5 LF-MCG/0.5 IM SUSP: 0.5000 mL | Freq: Once | INTRAMUSCULAR | Status: DC

## 2012-11-18 MED ORDER — WITCH HAZEL-GLYCERIN EX PADS
1.0000 "application " | MEDICATED_PAD | CUTANEOUS | Status: DC | PRN
  Administered 2012-11-19: 1 via TOPICAL

## 2012-11-18 MED ORDER — PRENATAL MULTIVITAMIN CH
1.0000 | ORAL_TABLET | Freq: Every day | ORAL | Status: DC
  Administered 2012-11-19 – 2012-11-20 (×2): 1 via ORAL
  Filled 2012-11-18 (×2): qty 1

## 2012-11-18 MED ORDER — FENTANYL 2.5 MCG/ML BUPIVACAINE 1/10 % EPIDURAL INFUSION (WH - ANES)
14.0000 mL/h | INTRAMUSCULAR | Status: DC | PRN
  Administered 2012-11-18: 14 mL/h via EPIDURAL
  Filled 2012-11-18: qty 125

## 2012-11-18 MED ORDER — SENNOSIDES-DOCUSATE SODIUM 8.6-50 MG PO TABS
2.0000 | ORAL_TABLET | Freq: Every day | ORAL | Status: DC
  Administered 2012-11-18 – 2012-11-19 (×2): 2 via ORAL

## 2012-11-18 MED ORDER — DIPHENHYDRAMINE HCL 25 MG PO CAPS: 25.0000 mg | ORAL_CAPSULE | Freq: Four times a day (QID) | ORAL | Status: DC | PRN

## 2012-11-18 MED ORDER — LACTATED RINGERS IV SOLN: 500.0000 mL | Freq: Once | INTRAVENOUS | Status: DC

## 2012-11-18 MED ORDER — IBUPROFEN 600 MG PO TABS: 600.0000 mg | ORAL_TABLET | Freq: Four times a day (QID) | ORAL | Status: DC | PRN

## 2012-11-18 MED ORDER — MISOPROSTOL 200 MCG PO TABS: 200.0000 ug | ORAL_TABLET | Freq: Once | ORAL | Status: DC

## 2012-11-18 MED ORDER — OXYCODONE-ACETAMINOPHEN 5-325 MG PO TABS: 1.0000 | ORAL_TABLET | ORAL | Status: DC | PRN

## 2012-11-18 MED ORDER — OXYCODONE-ACETAMINOPHEN 5-325 MG PO TABS
1.0000 | ORAL_TABLET | ORAL | Status: DC | PRN
  Administered 2012-11-18 – 2012-11-20 (×5): 1 via ORAL
  Filled 2012-11-18 (×5): qty 1

## 2012-11-18 MED ORDER — OXYTOCIN BOLUS FROM INFUSION
500.0000 mL | INTRAVENOUS | Status: DC
Start: 2012-11-18 — End: 2012-11-18
  Administered 2012-11-18: 500 mL via INTRAVENOUS

## 2012-11-18 MED ORDER — LIDOCAINE HCL (PF) 1 % IJ SOLN: 30.0000 mL | INTRAMUSCULAR | Status: DC | PRN

## 2012-11-18 MED ORDER — ACETAMINOPHEN 325 MG PO TABS: 650.0000 mg | ORAL_TABLET | ORAL | Status: DC | PRN

## 2012-11-18 MED ORDER — ONDANSETRON HCL 4 MG/2ML IJ SOLN
4.0000 mg | Freq: Four times a day (QID) | INTRAMUSCULAR | Status: DC | PRN
  Administered 2012-11-18: 4 mg via INTRAVENOUS
  Filled 2012-11-18: qty 2

## 2012-11-18 MED ORDER — LACTATED RINGERS IV SOLN
INTRAVENOUS | Status: DC
  Administered 2012-11-18: 125 mL/h via INTRAVENOUS
  Administered 2012-11-18: 07:00:00 via INTRAVENOUS

## 2012-11-18 MED ORDER — BENZOCAINE-MENTHOL 20-0.5 % EX AERO
1.0000 "application " | INHALATION_SPRAY | CUTANEOUS | Status: DC | PRN
  Administered 2012-11-19: 1 via TOPICAL
  Filled 2012-11-18 (×2): qty 56

## 2012-11-18 MED ORDER — CITRIC ACID-SODIUM CITRATE 334-500 MG/5ML PO SOLN: 30.0000 mL | ORAL | Status: DC | PRN

## 2012-11-18 MED ORDER — PHENYLEPHRINE 40 MCG/ML (10ML) SYRINGE FOR IV PUSH (FOR BLOOD PRESSURE SUPPORT)
80.0000 ug | PREFILLED_SYRINGE | INTRAVENOUS | Status: DC | PRN
  Filled 2012-11-18: qty 5

## 2012-11-18 MED ORDER — EPHEDRINE 5 MG/ML INJ: 10.0000 mg | INTRAVENOUS | Status: DC | PRN

## 2012-11-18 MED ORDER — TERBUTALINE SULFATE 1 MG/ML IJ SOLN: 0.2500 mg | Freq: Once | INTRAMUSCULAR | Status: DC | PRN

## 2012-11-18 MED ORDER — MISOPROSTOL 200 MCG PO TABS
ORAL_TABLET | ORAL | Status: AC
  Administered 2012-11-18: 400 ug
  Filled 2012-11-18: qty 2

## 2012-11-18 MED ORDER — MISOPROSTOL 200 MCG PO TABS
ORAL_TABLET | ORAL | Status: AC
  Administered 2012-11-18: 600 ug
  Filled 2012-11-18: qty 3

## 2012-11-18 MED ORDER — ONDANSETRON HCL 4 MG/2ML IJ SOLN: 4.0000 mg | INTRAMUSCULAR | Status: DC | PRN

## 2012-11-18 MED ORDER — OXYTOCIN 40 UNITS IN LACTATED RINGERS INFUSION - SIMPLE MED
1.0000 m[IU]/min | INTRAVENOUS | Status: DC
  Administered 2012-11-18: 2 m[IU]/min via INTRAVENOUS
  Filled 2012-11-18: qty 1000

## 2012-11-18 MED ORDER — LANOLIN HYDROUS EX OINT: TOPICAL_OINTMENT | CUTANEOUS | Status: DC | PRN

## 2012-11-18 MED ORDER — SODIUM BICARBONATE 8.4 % IV SOLN
INTRAVENOUS | Status: DC | PRN
  Administered 2012-11-18: 5 mL via EPIDURAL

## 2012-11-18 MED ORDER — DIBUCAINE 1 % RE OINT
1.0000 "application " | TOPICAL_OINTMENT | RECTAL | Status: DC | PRN
  Administered 2012-11-19: 1 via RECTAL
  Filled 2012-11-18: qty 28

## 2012-11-18 MED ORDER — ONDANSETRON HCL 4 MG PO TABS: 4.0000 mg | ORAL_TABLET | ORAL | Status: DC | PRN

## 2012-11-18 MED ORDER — LACTATED RINGERS IV SOLN: 500.0000 mL | INTRAVENOUS | Status: DC | PRN

## 2012-11-18 MED ORDER — SIMETHICONE 80 MG PO CHEW: 80.0000 mg | CHEWABLE_TABLET | ORAL | Status: DC | PRN

## 2012-11-18 MED ORDER — ZOLPIDEM TARTRATE 5 MG PO TABS: 5.0000 mg | ORAL_TABLET | Freq: Every evening | ORAL | Status: DC | PRN

## 2012-11-18 MED ORDER — IBUPROFEN 600 MG PO TABS
600.0000 mg | ORAL_TABLET | Freq: Four times a day (QID) | ORAL | Status: DC
  Administered 2012-11-18 – 2012-11-20 (×7): 600 mg via ORAL
  Filled 2012-11-18 (×7): qty 1

## 2012-11-18 MED ORDER — DIPHENHYDRAMINE HCL 50 MG/ML IJ SOLN
12.5000 mg | INTRAMUSCULAR | Status: DC | PRN
  Filled 2012-11-18: qty 1

## 2012-11-18 MED ORDER — OXYTOCIN 40 UNITS IN LACTATED RINGERS INFUSION - SIMPLE MED: 62.5000 mL/h | INTRAVENOUS | Status: DC

## 2012-11-18 NOTE — Progress Notes (Signed)
Patient ID: [name], female   DOB: Jan 21, 1981, 32 y.o.   MRN: [id number] Called to [facility] by RN for Pt reporting feeling SOB, sats 890% on RA. O2 applied. Anesthesia called. Pt felling better sitting upright w/ O2 on. Dr. Jean Rosenthal, anesthesia at [facility]. Lungs clear. O2 off, sats 85-100%. Will leave 02 on through delivery and CTO closely.    S: No pressure w/ UC's, Mild SOB w/ O2 off, normal w/ O2 on.  O: BP 98/60  Pulse 112  Temp(Src) 98.5 F (36.9 C) (Oral)  Resp 20  Ht 5\' 2"  (1.575 m)  Wt 84.369 kg (186 lb)  BMI 34.01 kg/m2  SpO2 86%  LMP 02/19/2012   FHR 145, minimal variabi;ty, pos acels, early decels.  Toco: UC's Q 2-4, strong  Dilation: 10 Dilation Complete Date: 11/18/12 Dilation Complete Time: 1637 Effacement (%): 100 Cervical Position: Middle Station: 0 Presentation: Vertex Exam by:: Dr. Ermalinda Memos  A: 1. Intermittently decreased O2 sats of unknown etiology.  2. Start second stage 3. FHR category II  P:  1. Labor down 2. 2 10 L by facemask PRN. 3. Anticipate SVD. 4. Dr. Macon Large notified.  [place], CNM 11/18/2012 4:54 PM

## 2012-11-18 NOTE — Progress Notes (Signed)
[name] is a 32 y.o. [id number] at [redacted]w[redacted]d by LMP admitted for induction of labor due to Previous fetal demise.  Subjective: Epidural working well, comfortable, feeling pressure  Objective: BP 124/78  Pulse 83  Temp(Src) 98.2 F (36.8 C) (Oral)  Resp 18  Ht 5\' 2"  (1.575 m)  Wt 84.369 kg (186 lb)  BMI 34.01 kg/m2  SpO2 98%  LMP 02/19/2012      FHT:  FHR: 140 bpm, variability: moderate,  accelerations:  Present,  decelerations:  Present Early decels UC:   inntermittently seen due to positining,  Contractions q1.5-3 minutes by palpation by nurse SVE:    Dilation: 6 Effacement (%): 90 Cervical Position: middle Station: -2 Presentation: Vertex Exam by:: Dr. Ermalinda Memos  Labs: Lab Results  Component Value Date   WBC 7.3 11/18/2012   HGB 10.1* 11/18/2012   HCT 30.4* 11/18/2012   MCV 87.9 11/18/2012   PLT 160 11/18/2012    Assessment / Plan: Induction of labor due to previous fetal demise,  progressing well on pitocin  Labor: Progressing on Pit, possible SROM about 1130 am Preeclampsia:  none\ Fetal Wellbeing:  Category I, will consider IUPC if early decels continue and Toco not picking up well.  Pain Control:  Epidural I/D:  GBS neg Anticipated MOD:  NSVD  Kevin Fenton 11/18/2012, 2:41 PM

## 2012-11-18 NOTE — Progress Notes (Signed)
[name] is a 32 y.o. [id number] at [redacted]w[redacted]d by LMP admitted for induction of labor due to Previous fetal demise.  Subjective: Epidural working well, comfortable  Objective: BP 114/66  Pulse 80  Temp(Src) 98.2 F (36.8 C) (Oral)  Resp 18  Ht 5\' 2"  (1.575 m)  Wt 84.369 kg (186 lb)  BMI 34.01 kg/m2  SpO2 98%  LMP 02/19/2012      FHT:  FHR: 140 bpm, variability: moderate,  accelerations:  Present,  decelerations:  Absent UC:   irregular, every 2-4 min SVE:   Dilation: 5 Effacement (%): 80 Station: -2 Exam by:: Valentina Lucks, RN  Labs: Lab Results  Component Value Date   WBC 7.3 11/18/2012   HGB 10.1* 11/18/2012   HCT 30.4* 11/18/2012   MCV 87.9 11/18/2012   PLT 160 11/18/2012    Assessment / Plan: Induction of labor due to previous fetal demise,  progressing well on pitocin  Labor: Progressing on Pit, possible SROM 30 minutes ago Preeclampsia:  none\ Fetal Wellbeing:  Category I Pain Control:  Epidural I/D:  GBS neg Anticipated MOD:  NSVD  Kevin Fenton 11/18/2012, 11:54 AM

## 2012-11-18 NOTE — Progress Notes (Signed)
Pt. C/o of shortness of breath and unable to take deep breaths. MD and anesthesia notified.

## 2012-11-18 NOTE — H&P (Signed)
[name] is a 32 y.o. female presenting for IOL for previous intrauterine fetal demise. She states she began to have contractions around 32 weeks which have been mostly irregular. They have occasionally become regular and stronger for up to an hour but they are irregular today. She denies LOF, vaginal bleeding, vaginal discharge, and decreased fetal movement. She denies dyspnea, chest pain, headache, vision changes, dysuria, and constipation. SHe has had some mild BL LE edema for the last 2 days. She gets her care in [facility] and denies any significant problems this pregnancy.   HRC Genetic Screen Nml first screen; AFP normal  Anatomic [place] Normal  Glucose Screen 1 hr GTT 90  GBS Negative  Feeding Preference  Breastfeeding  Contraception  Depo-Provera  Circumcision      History OB History   Grav Para Term Preterm Abortions TAB SAB Ect Mult Living   5 2 1 1 2  2   1      Past Medical History  Diagnosis Date  . Abnormal Pap smear    Past Surgical History  Procedure Laterality Date  . Cervix lesion destruction     Family History: family history includes Hypertension in her maternal grandfather, maternal grandmother, and mother and Mental illness in her mother. Social History:  reports that she quit smoking about 5 months ago. Her smoking use included Cigarettes. She has a 6 pack-year smoking history. She has never used smokeless tobacco. She reports that  drinks alcohol. She reports that she uses illicit drugs (Marijuana).   Prenatal Transfer Tool  Maternal Diabetes: No Genetic Screening: Normal Maternal Ultrasounds/Referrals: Normal Fetal Ultrasounds or other Referrals:  None Maternal Substance Abuse:  No Significant Maternal Medications:  None Significant Maternal Lab Results:  None Other Comments:  None  ROS Per HPI  Dilation: 4 Effacement (%): 80 Station: -2 Exam by:: lee Blood pressure 125/69, pulse 90, temperature 97.5 F (36.4 C), temperature source Oral, resp. rate  18, height 5\' 2"  (1.575 m), weight 84.369 kg (186 lb), last menstrual period 02/19/2012. Exam Physical Exam   Gen: NAD, alert, cooperative with exam HEENT: NCAT, EOMI, PERRL CV: RRR, good S1/S2, no murmur Resp: CTABL, no wheezes, non-labored Abd: Soft pregnant abdomen, no tenderness to palpation Ext: trace pitting edema Neuro: Alert and oriented, No gross deficits  FHT: Baseline 145, moderate variability, no accels, no decels--likely intermittently picking up mother's HR Toco: irreg q2-5 min   Prenatal labs: ABO, Rh: B/Positive/-- (09/04 0000) Antibody: Negative (09/04 0000) Rubella: Immune (09/04 0000) RPR: NON REAC (12/26 0947)  HBsAg: Negative (09/04 0000)  HIV: Non-reactive (09/04 0000)  GBS: Negative (02/16 0000)   Assessment/Plan: 32 y/o [id number] at 39.0 here for IOL due to previous IUFD - Favorable cervix with irreg contractions--> start pitocin - Category 1 Fetal Strip- place maternal pulseox to make it easier to differentiate.  - GBS negative - Anticipate SVD  Kevin Fenton 11/18/2012, 9:15 AM

## 2012-11-18 NOTE — Progress Notes (Signed)
Pt. Complains of shortness of breath and oxygen reapplied. MD and anesthesia in room

## 2012-11-18 NOTE — Anesthesia Preprocedure Evaluation (Signed)

## 2012-11-18 NOTE — Anesthesia Procedure Notes (Signed)

## 2012-11-18 NOTE — H&P (Addendum)
Dating is by LMP, verified by 6.1 week [place].   I was present for the exam and agree with above.  [place], CNM 11/18/2012 10:30 AM

## 2012-11-18 NOTE — Progress Notes (Signed)
I was present for the exam and agree with above.  [place], CNM 11/18/2012 1:27 PM

## 2012-11-18 NOTE — Progress Notes (Signed)
I was present for the exam and agree with above.  [place], CNM 11/18/2012 7:30 PM

## 2012-11-19 LAB — CBC
Hemoglobin: 9.4 g/dL — ABNORMAL LOW (ref 12.0–15.0)
MCHC: 33.2 g/dL (ref 30.0–36.0)
RBC: 3.24 MIL/uL — ABNORMAL LOW (ref 3.87–5.11)
WBC: 13.7 10*3/uL — ABNORMAL HIGH (ref 4.0–10.5)

## 2012-11-19 NOTE — Clinical Social Work Maternal (Signed)
    Clinical Social Work Department PSYCHOSOCIAL ASSESSMENT - MATERNAL/CHILD 11/19/2012  Patient:  [name], [name]  Account Number:  [id number]  Admit Date:  11/18/2012  Marjo Bicker Name:   Mirian Capuchin    Clinical Social Worker:  Nobie [place], LCSW   Date/Time:  11/19/2012 02:56 PM  Date Referred:  11/19/2012   Referral source  CN     Referred reason  Substance Abuse   Other referral source:    I:  FAMILY / HOME ENVIRONMENT Child's legal guardian:  PARENT  Guardian - Name Guardian - Age Guardian - Address  Kirti Carl [address] [place], [place] [address]  Melven Sartorius 29    Other household support members/support persons Name Relationship DOB   SON 11 years old   Other support:   Family & Friends    II  PSYCHOSOCIAL DATA Information Source:  Patient Interview  Surveyor, quantity and Community Resources Employment:   Insurance risk surveyor resources:  [employer] If Medicaid - County:  [place] Other  [employer]  Food Stamps  Section 8   School / Grade:   Maternity Care Coordinator / Child Services Coordination / Early Interventions:   Jeanella Craze  Cultural issues impacting care:    III  STRENGTHS Strengths  Adequate Resources  Home prepared for Child (including basic supplies)  Supportive family/friends   Strength comment:    IV  RISK FACTORS AND CURRENT PROBLEMS Current Problem:  YES   Risk Factor & Current Problem Patient Issue Family Issue Risk Factor / Current Problem Comment  Substance Abuse Y N Hx of MJ    V  SOCIAL WORK ASSESSMENT CSW met with pt to assess history of MJ use.  Pt admits to smoking MJ "occasionally," as she told CSW that it wasn't an "everyday thing."  Once she received pregnancy confirmation at 6 weeks, she stopped smoking MJ immediately.  Pt also smoked cigarettes, of which she was able to stop around her 3rd month.  She denies other illegal substance use & verbalized understanding of hospital drug testing policy.  UDS is negative,  meconium results are pending.  Pt has all the necessary supplies for the infant & a good support system.  She denies any history of depression or SI.  Pt appears appropriate at this time. CSW will monitor drug screen results & make a referral if needed.      VI SOCIAL WORK PLAN Social Work Plan  No Further Intervention Required / No Barriers to Discharge   Type of pt/family education:   If child protective services report - county:   If child protective services report - date:   Information/referral to community resources comment:   Other social work plan:

## 2012-11-19 NOTE — Discharge Summary (Signed)
Obstetric Discharge Summary  Reason for Admission: onset of labor Prenatal Procedures: none Intrapartum Procedures: spontaneous vaginal delivery Postpartum Procedures: none Complications-Operative and Postpartum: none Hemoglobin  Date Value Range Status  11/19/2012 9.4* 12.0 - 15.0 g/dL Final  04/11/9561 13.0   Final     HCT  Date Value Range Status  11/19/2012 28.3* 36.0 - 46.0 % Final  05/14/2012 34   Final   Feeling well, mild pain - pain meds helping. Breastfeeding going well. Wants Depo shot. Ready to go home.   Denies any dizziness, lightheadedness, weakness, fatigue.  Physical Exam:  General: alert, cooperative, appears stated age and no distress Lochia: Did not assess Uterine Fundus: firm Incision: none present DVT Evaluation: No evidence of DVT seen on physical exam. Negative Homan's sign. No cords or calf tenderness.  Discharge Diagnoses: Term Pregnancy-delivered  Discharge Information: Date: 11/19/2012 Activity: pelvic rest Diet: routine Medications: Ibuprofen Condition: stable Instructions: refer to practice specific booklet Discharge to: home   Newborn Data: Live born female  Birth Weight: 8 lb 4.6 oz (3759 g) APGAR: 8, 9  Home with mother.  Lorna Dibble, PA-S 11/19/2012, 7:37 AM  Discharge planning in am

## 2012-11-19 NOTE — Progress Notes (Signed)
Post Partum Day 1 Subjective: no complaints, up ad lib, voiding, tolerating PO and + flatus  Objective: Blood pressure 118/74, pulse 80, temperature 98.6 F (37 C), temperature source Oral, resp. rate 16, height 5\' 2"  (1.575 m), weight 84.369 kg (186 lb), last menstrual period 02/19/2012, SpO2 96.00%, unknown if currently breastfeeding.  Physical Exam:  General: alert, cooperative and no distress Lochia: appropriate Uterine Fundus: firm  DVT Evaluation: No evidence of DVT seen on physical exam.   Recent Labs  11/18/12 0720 11/19/12 0457  HGB 10.1* 9.4*  HCT 30.4* 28.3*    Assessment/Plan: Plan for discharge tomorrow   LOS: 1 day   POE,DEIRDRE 11/19/2012, 7:57 AM

## 2012-11-20 MED ORDER — OXYCODONE-ACETAMINOPHEN 5-325 MG PO TABS
1.0000 | ORAL_TABLET | Freq: Four times a day (QID) | ORAL | Status: DC | PRN
Start: 2012-11-20 — End: 2014-11-08

## 2012-11-20 MED ORDER — IBUPROFEN 600 MG PO TABS: 600.0000 mg | ORAL_TABLET | Freq: Four times a day (QID) | ORAL | Status: DC

## 2012-11-20 NOTE — Discharge Summary (Signed)
Attestation of Attending Supervision of Advanced Practitioner (CNM/NP): Evaluation and management procedures were performed by the Advanced Practitioner under my supervision and collaboration.  I have reviewed the Advanced Practitioner's note and chart, and I agree with the management and plan.  CONSTANT,PEGGY 11/20/2012 11:38 PM

## 2012-11-20 NOTE — Discharge Summary (Signed)
Obstetric Discharge Summary Reason for Admission: induction of labor and For previous intrauterine fetal demise Prenatal Procedures: none Intrapartum Procedures: spontaneous vaginal delivery Postpartum Procedures: none Complications-Operative and Postpartum: none Hemoglobin  Date Value Range Status  11/19/2012 9.4* 12.0 - 15.0 g/dL Final  09/15/1094 04.5   Final     HCT  Date Value Range Status  11/19/2012 28.3* 36.0 - 46.0 % Final  05/14/2012 34   Final  Hospital Course: Admission Note:[name] is a 32 y.o. female presenting for IOL for previous intrauterine fetal demise. She states she began to have contractions around 32 weeks which have been mostly irregular. They have occasionally become regular and stronger for up to an hour but they are irregular today. She denies LOF, vaginal bleeding, vaginal discharge, and decreased fetal movement. She denies dyspnea, chest pain, headache, vision changes, dysuria, and constipation. SHe has had some mild BL LE edema for the last 2 days. She gets her care in [facility] and denies any significant problems this pregnancy.  Delivery Note  At 5:59 PM a viable and healthy female was delivered via Vaginal, Spontaneous Delivery (Presentation: Left Occiput Anterior). APGAR: 8, 9; weight 8 lb 4.6 oz (3759 g).  Placenta status: Intact, Spontaneous. Cord: 3 vessels with the following complications: None. Cord pH: n/a  Anesthesia: Epidural  Episiotomy: None  Lacerations: None  Suture Repair: N/a  Est. Blood Loss (mL): 450  Mom to postpartum. Baby to nursery-stable.  Has done well postpartum.    Physical Exam:  General: alert, cooperative and no distress Lochia: appropriate Uterine Fundus: firm Incision: None DVT Evaluation: No evidence of DVT seen on physical exam. Negative Homan's sign. No cords or calf tenderness.  Discharge Diagnoses: Term Pregnancy-delivered  Discharge Information: Date: 11/20/2012 Activity: unrestricted Diet:  routine Medications: PNV and Ibuprofen Condition: stable Instructions: refer to practice specific booklet Discharge to: home Birth control: Depo injection- appt 1 week requested Follow-up Information   Follow up with [facility]. (You will be called to schedule depot injection in 1 week and postpartum visit in 4-6 weeks)    Contact information:   [address] [place] [address] [phone]      Newborn Data: Live born female  Birth Weight: 8 lb 4.6 oz (3759 g) APGAR: 8, 9  Home with mother.  Kevin Fenton 11/20/2012, 9:10 AM  Seen and examined Agree with note: Wynelle Bourgeois CNM

## 2012-11-24 NOTE — H&P (Signed)

## 2012-11-25 NOTE — H&P (Signed)

## 2012-11-27 ENCOUNTER — Encounter (INDEPENDENT_AMBULATORY_CARE_PROVIDER_SITE_OTHER): Payer: Managed Care, Other (non HMO) | Admitting: Obstetrics & Gynecology

## 2012-12-02 NOTE — Progress Notes (Signed)
NST 11-17-12 reviewed and reactive 

## 2012-12-10 NOTE — Progress Notes (Signed)
Erroneous encounter

## 2012-12-17 ENCOUNTER — Other Ambulatory Visit: Payer: Self-pay | Admitting: Obstetrics & Gynecology

## 2012-12-17 ENCOUNTER — Ambulatory Visit (INDEPENDENT_AMBULATORY_CARE_PROVIDER_SITE_OTHER): Payer: Managed Care, Other (non HMO) | Admitting: Obstetrics & Gynecology

## 2012-12-17 ENCOUNTER — Encounter: Payer: Self-pay | Admitting: Obstetrics & Gynecology

## 2012-12-17 VITALS — BP 137/99 | HR 78 | Temp 97.1°F | Ht 62.0 in | Wt 157.6 lb

## 2012-12-17 DIAGNOSIS — Z09 Encounter for follow-up examination after completed treatment for conditions other than malignant neoplasm: Secondary | ICD-10-CM

## 2012-12-17 LAB — POCT PREGNANCY, URINE: Preg Test, Ur: POSITIVE — AB

## 2012-12-17 MED ORDER — MEDROXYPROGESTERONE ACETATE 150 MG/ML IM SUSP: 150.0000 mg | INTRAMUSCULAR | Status: DC

## 2012-12-17 NOTE — Progress Notes (Signed)
  Subjective:    Patient ID: [name], female    DOB: 1981-04-30, 30 y.o.   MRN: [id number]  HPI  32 yo S AA P2 here for her 4 weeks PP visit. She reports normal bowel and bladder function. She denies depression. She really wants to restart depo provera because she really doesn't want another pregnancy at this time. She has not had sex yet but is "talking to" a fellow for many months and thinks that she may have sex with him. She is breast and bottle feeding and reports that her daughter [name] is growing well. She wakes her up 5-6 times per night. She is scheduled to go back to work on 12-29-12.  Review of Systems Her pap was normal 8/13 She had no tears or cuts with delivery and denies any vaginal problems.    Objective:   Physical Exam        Assessment & Plan:  Pp- doing well I have explained that depo provera and worsen depression but she very much wants to use it and will let me know if she gets depressed. Depo today RTC q 12 weeks for depo/ prn sooner.

## 2012-12-17 NOTE — Progress Notes (Signed)
Patient ID: [name], female   DOB: 05-05-1981, 32 y.o.   MRN: [id number]  Patient c/o severe left wrist pain that does not improve with stretches or wrapping it with an ACE bandage.  Worse in the morning.

## 2012-12-30 ENCOUNTER — Encounter: Payer: Self-pay | Admitting: Obstetrics & Gynecology

## 2013-01-07 ENCOUNTER — Ambulatory Visit: Payer: Managed Care, Other (non HMO)

## 2013-01-27 ENCOUNTER — Ambulatory Visit (INDEPENDENT_AMBULATORY_CARE_PROVIDER_SITE_OTHER): Payer: Managed Care, Other (non HMO)

## 2013-01-27 VITALS — BP 115/72 | HR 78 | Temp 98.6°F | Ht 62.0 in | Wt 149.7 lb

## 2013-01-27 DIAGNOSIS — Z3049 Encounter for surveillance of other contraceptives: Secondary | ICD-10-CM

## 2013-01-27 DIAGNOSIS — Z3042 Encounter for surveillance of injectable contraceptive: Secondary | ICD-10-CM

## 2013-01-27 LAB — POCT PREGNANCY, URINE: Preg Test, Ur: NEGATIVE

## 2013-01-27 MED ORDER — MEDROXYPROGESTERONE ACETATE 150 MG/ML IM SUSP
150.0000 mg | Freq: Once | INTRAMUSCULAR | Status: AC
  Administered 2013-01-27: 150 mg via INTRAMUSCULAR

## 2013-04-17 ENCOUNTER — Ambulatory Visit: Payer: Managed Care, Other (non HMO)

## 2013-04-21 ENCOUNTER — Ambulatory Visit: Payer: Managed Care, Other (non HMO)

## 2013-07-16 ENCOUNTER — Other Ambulatory Visit: Payer: Self-pay

## 2013-07-28 ENCOUNTER — Ambulatory Visit (INDEPENDENT_AMBULATORY_CARE_PROVIDER_SITE_OTHER): Payer: Managed Care, Other (non HMO) | Admitting: Family Medicine

## 2013-07-28 VITALS — BP 100/64 | HR 70 | Temp 98.1°F | Resp 16 | Ht 63.75 in | Wt 140.4 lb

## 2013-07-28 DIAGNOSIS — Z0000 Encounter for general adult medical examination without abnormal findings: Secondary | ICD-10-CM

## 2013-07-28 LAB — POCT URINALYSIS DIPSTICK
Bilirubin, UA: NEGATIVE
Glucose, UA: NEGATIVE
Ketones, UA: NEGATIVE
Leukocytes, UA: NEGATIVE
Nitrite, UA: NEGATIVE
Protein, UA: NEGATIVE
Spec Grav, UA: 1.015
Urobilinogen, UA: 0.2
pH, UA: 7

## 2013-07-28 LAB — POCT CBC
Granulocyte percent: 42 %G (ref 37–80)
HCT, POC: 44.1 % (ref 37.7–47.9)
Hemoglobin: 14 g/dL (ref 12.2–16.2)
Lymph, poc: 2 (ref 0.6–3.4)
MCH, POC: 29.2 pg (ref 27–31.2)
MCHC: 31.7 g/dL — AB (ref 31.8–35.4)
MCV: 91.9 fL (ref 80–97)
MID (cbc): 0.3 (ref 0–0.9)
MPV: 9.7 fL (ref 0–99.8)
POC Granulocyte: 1.7 — AB (ref 2–6.9)
POC LYMPH PERCENT: 50.6 %L — AB (ref 10–50)
POC MID %: 7.4 %M (ref 0–12)
Platelet Count, POC: 258 10*3/uL (ref 142–424)
RBC: 4.8 M/uL (ref 4.04–5.48)
RDW, POC: 15 %
WBC: 4 10*3/uL — AB (ref 4.6–10.2)

## 2013-07-28 LAB — POCT UA - MICROSCOPIC ONLY
Bacteria, U Microscopic: NEGATIVE
Casts, Ur, LPF, POC: NEGATIVE
Crystals, Ur, HPF, POC: NEGATIVE
Mucus, UA: NEGATIVE
RBC, urine, microscopic: NEGATIVE
Yeast, UA: NEGATIVE

## 2013-07-28 NOTE — Patient Instructions (Signed)

## 2013-07-28 NOTE — Progress Notes (Signed)
32 year old woman who works for [employer] comes in with her 2 children, younger is 8 months. Patient needs a physical for work. She's having no complaints.  Patient's last Pap test was when her daughter was born, with instructions at her next Pap test wasn't due until 2013  Objective: No acute distress HEENT: Unremarkable Neck: Supple no adenopathy Chest: Clear Heart: Regular no murmur Abdomen: Soft nontender without HSM or masses. Skin: Unremarkable without rashes Extremities: Full range of motion, no edema Neurologically: Moving 4 extremities well, normal sensation, normal cranial nerves III through XII  Assessment: Patient's health is normal. She is up-to-date on preventative testing   Routine general medical examination at a health care facility - Plan: POCT CBC, POCT UA - Microscopic Only, POCT urinalysis dipstick, Comprehensive metabolic panel, Lipid panel  Signed, Elvina Sidle, MD

## 2013-07-29 LAB — COMPREHENSIVE METABOLIC PANEL
ALT: 19 U/L (ref 0–35)
AST: 15 U/L (ref 0–37)
Albumin: 5.1 g/dL (ref 3.5–5.2)
Alkaline Phosphatase: 73 U/L (ref 39–117)
BUN: 12 mg/dL (ref 6–23)
CO2: 30 mEq/L (ref 19–32)
Calcium: 10 mg/dL (ref 8.4–10.5)
Chloride: 102 mEq/L (ref 96–112)
Creat: 0.81 mg/dL (ref 0.50–1.10)
Glucose, Bld: 82 mg/dL (ref 70–99)
Potassium: 3.2 mEq/L — ABNORMAL LOW (ref 3.5–5.3)
Sodium: 137 mEq/L (ref 135–145)
Total Bilirubin: 0.4 mg/dL (ref 0.3–1.2)
Total Protein: 7.7 g/dL (ref 6.0–8.3)

## 2013-07-29 LAB — LIPID PANEL
Cholesterol: 221 mg/dL — ABNORMAL HIGH (ref 0–200)
HDL: 51 mg/dL (ref >39)
LDL Cholesterol: 136 mg/dL — ABNORMAL HIGH (ref 0–99)
Total CHOL/HDL Ratio: 4.3 Ratio
Triglycerides: 168 mg/dL — ABNORMAL HIGH (ref <150)
VLDL: 34 mg/dL (ref 0–40)

## 2013-07-30 ENCOUNTER — Other Ambulatory Visit: Payer: Self-pay | Admitting: Family Medicine

## 2013-07-30 DIAGNOSIS — E876 Hypokalemia: Secondary | ICD-10-CM

## 2013-07-30 MED ORDER — POTASSIUM CHLORIDE CRYS ER 20 MEQ PO TBCR: 20.0000 meq | EXTENDED_RELEASE_TABLET | Freq: Every day | ORAL | Status: DC

## 2013-09-24 ENCOUNTER — Encounter: Payer: Self-pay | Admitting: Family Medicine

## 2013-09-24 ENCOUNTER — Ambulatory Visit (INDEPENDENT_AMBULATORY_CARE_PROVIDER_SITE_OTHER): Payer: BC Managed Care – PPO | Admitting: Family Medicine

## 2013-09-24 VITALS — BP 126/84 | HR 78 | Temp 97.7°F | Resp 16 | Ht 63.5 in | Wt 143.0 lb

## 2013-09-24 DIAGNOSIS — I10 Essential (primary) hypertension: Secondary | ICD-10-CM

## 2013-09-24 DIAGNOSIS — Z0279 Encounter for issue of other medical certificate: Secondary | ICD-10-CM

## 2013-09-24 DIAGNOSIS — F17200 Nicotine dependence, unspecified, uncomplicated: Secondary | ICD-10-CM

## 2013-09-24 DIAGNOSIS — F418 Other specified anxiety disorders: Secondary | ICD-10-CM | POA: Insufficient documentation

## 2013-09-24 DIAGNOSIS — F341 Dysthymic disorder: Secondary | ICD-10-CM

## 2013-09-24 DIAGNOSIS — F17210 Nicotine dependence, cigarettes, uncomplicated: Secondary | ICD-10-CM

## 2013-09-24 DIAGNOSIS — O10919 Unspecified pre-existing hypertension complicating pregnancy, unspecified trimester: Secondary | ICD-10-CM | POA: Insufficient documentation

## 2013-09-24 LAB — BASIC METABOLIC PANEL
BUN: 14 mg/dL (ref 6–23)
CALCIUM: 9.1 mg/dL (ref 8.4–10.5)
CO2: 25 mEq/L (ref 19–32)
CREATININE: 0.7 mg/dL (ref 0.4–1.2)
Chloride: 104 mEq/L (ref 96–112)
GFR: 120.13 mL/min (ref >60.00)
Glucose, Bld: 76 mg/dL (ref 70–99)
Potassium: 3.2 mEq/L — ABNORMAL LOW (ref 3.5–5.1)
SODIUM: 138 meq/L (ref 135–145)

## 2013-09-24 MED ORDER — HYDROCHLOROTHIAZIDE 12.5 MG PO CAPS: 12.5000 mg | ORAL_CAPSULE | Freq: Every day | ORAL | Status: DC

## 2013-09-24 MED ORDER — CITALOPRAM HYDROBROMIDE 20 MG PO TABS: 20.0000 mg | ORAL_TABLET | Freq: Every day | ORAL | Status: DC

## 2013-09-24 NOTE — Assessment & Plan Note (Signed)
New.  Moderate-severe.  Pt's son is making suicidal statements and pt is appropriately distraught.  Encouraged her to immediately call pediatrician and get counseling for both her son and herself.  Start SSRI for pt.  Filled out STD forms for pt so that she can be out of work to attend to her son.  Will follow closely.

## 2013-09-24 NOTE — Assessment & Plan Note (Signed)
New to provider, ongoing for pt.  Has been out of meds- adequate control today but pt reports increased HAs.  Will restart medication but suspect that pt's increased HAs are due to stress. Will follow.

## 2013-09-24 NOTE — Patient Instructions (Signed)
Follow up in 3-4 weeks to recheck mood Start Celexa daily for depression and anxiety Restart the HCTZ daily for blood pressure Call and schedule counseling for yourself and your son- this is VERY important Call your pediatrician TODAY to schedule an appt Call with any questions or concerns Hang in there!!
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
## 2013-09-24 NOTE — Assessment & Plan Note (Signed)
Encouraged pt to quit smoking.  Not ready at this time.  Will follow.

## 2013-09-24 NOTE — Progress Notes (Signed)
Pre visit review using our clinic review tool, if applicable. No additional management support is needed unless otherwise documented below in the visit note. 

## 2013-09-25 ENCOUNTER — Telehealth: Payer: Self-pay | Admitting: *Deleted

## 2013-09-25 NOTE — Telephone Encounter (Signed)
09/24/2013 Pt established care with T[facility]. 09/24/2013 [facility] requesting Attending Physician Statement be completed by Tabor[facility]. 09/25/2013 Sent completed paperwork by fax to [employer] [phone]. Copy sent to batch, copy sent to billing.  bw

## 2013-10-05 ENCOUNTER — Telehealth: Payer: Self-pay | Admitting: *Deleted

## 2013-10-05 NOTE — Telephone Encounter (Signed)
10/02/2013 [facility] from [facility] requesting office visit notes from 09/24/2013, next OV, Estimated return to work with and without restrictions, current Tx plan, current symptoms preventing pt from working and current restrictions preventing pt from working.  Per Beverely Lowabori, printed office visit notes from 09/24/2013 that included all the information and faxed back to [employer].  bw

## 2013-10-11 ENCOUNTER — Telehealth: Payer: Self-pay | Admitting: Family Medicine

## 2013-10-11 NOTE — Telephone Encounter (Signed)
Relevant patient education assigned to patient using Emmi. ° °

## 2013-10-12 NOTE — Telephone Encounter (Signed)
10/12/2013 Paperwork was not stamped faxed, [facility] [phone].  Copy sent to batch, no charge per Tabori.  bw

## 2013-10-14 ENCOUNTER — Encounter: Payer: Self-pay | Admitting: General Practice

## 2013-10-14 ENCOUNTER — Telehealth: Payer: Self-pay | Admitting: Family Medicine

## 2013-10-14 NOTE — Telephone Encounter (Signed)
Relevant patient education mailed to patient.  

## 2013-10-15 ENCOUNTER — Ambulatory Visit: Payer: Self-pay | Admitting: Family Medicine

## 2013-10-20 ENCOUNTER — Encounter: Payer: Self-pay | Admitting: Family Medicine

## 2013-10-20 ENCOUNTER — Ambulatory Visit (INDEPENDENT_AMBULATORY_CARE_PROVIDER_SITE_OTHER): Payer: BC Managed Care – PPO | Admitting: Family Medicine

## 2013-10-20 VITALS — BP 140/80 | HR 90 | Temp 99.2°F | Resp 16 | Wt 143.4 lb

## 2013-10-20 DIAGNOSIS — I10 Essential (primary) hypertension: Secondary | ICD-10-CM

## 2013-10-20 DIAGNOSIS — F418 Other specified anxiety disorders: Secondary | ICD-10-CM

## 2013-10-20 DIAGNOSIS — F341 Dysthymic disorder: Secondary | ICD-10-CM

## 2013-10-20 LAB — BASIC METABOLIC PANEL
BUN: 13 mg/dL (ref 6–23)
CHLORIDE: 104 meq/L (ref 96–112)
CO2: 28 mEq/L (ref 19–32)
Calcium: 9.2 mg/dL (ref 8.4–10.5)
Creatinine, Ser: 0.7 mg/dL (ref 0.4–1.2)
GFR: 134.88 mL/min (ref >60.00)
Glucose, Bld: 70 mg/dL (ref 70–99)
POTASSIUM: 3.3 meq/L — AB (ref 3.5–5.1)
SODIUM: 139 meq/L (ref 135–145)

## 2013-10-20 MED ORDER — CITALOPRAM HYDROBROMIDE 40 MG PO TABS: 40.0000 mg | ORAL_TABLET | Freq: Every day | ORAL | Status: DC

## 2013-10-20 MED ORDER — ALPRAZOLAM 0.5 MG PO TABS: 0.5000 mg | ORAL_TABLET | Freq: Two times a day (BID) | ORAL | Status: DC | PRN

## 2013-10-20 NOTE — Patient Instructions (Signed)
Follow up in 1 month to recheck mood Increase Celexa to 40mg - 2 of your current pills and 1 of the new script Start the Xanax as needed for those panicked moments We'll notify you of your lab results and make any changes if needed Our plan will be to go back to work on Monday Feb 23 So proud of you for starting counseling!!!

## 2013-10-20 NOTE — Progress Notes (Signed)
   Subjective:    Patient ID: [name], female    DOB: [birth year], 33 y.o.   MRN: [id number]  HPI HTN- restarted on HCTZ last month.  Due for repeat BMP.    Depression- son was admitted to [facility] x6 days for severe depression, ADHD and suicidal ideation.  Is starting outpt therapy for both her and son.  Started on Celexa at last visit.  Doesn't feel it's working, 'at all'.  Has previously been on benzos prn and thought these were helpful.   Review of Systems For ROS see HPI     Objective:   Physical Exam  Constitutional: She is oriented to person, place, and time. She appears well-developed and well-nourished. No distress.  HENT:  Head: Normocephalic and atraumatic.  Eyes: Conjunctivae and EOM are normal. Pupils are equal, round, and reactive to light.  Neck: Normal range of motion. Neck supple. No thyromegaly present.  Cardiovascular: Normal rate, regular rhythm, normal heart sounds and intact distal pulses.   No murmur heard. Pulmonary/Chest: Effort normal and breath sounds normal. No respiratory distress.  Abdominal: Soft. She exhibits no distension. There is no tenderness.  Musculoskeletal: She exhibits no edema.  Lymphadenopathy:    She has no cervical adenopathy.  Neurological: She is alert and oriented to person, place, and time.  Skin: Skin is warm and dry.  Psychiatric: She has a normal mood and affect. Her behavior is normal.          Assessment & Plan:

## 2013-10-20 NOTE — Assessment & Plan Note (Signed)
Chronic problem.  Adequate control since restarting HCTZ.  HAs have improved.  Check BMP.  No anticipated changes.

## 2013-10-20 NOTE — Progress Notes (Signed)
Pre visit review using our clinic review tool, if applicable. No additional management support is needed unless otherwise documented below in the visit note. 

## 2013-10-20 NOTE — Assessment & Plan Note (Signed)
Improved since last visit but pt has a way to go.  Increase celexa to 40mg  daily.  Xanax given as needed for rescue in panicked moments.  Pt starts outpt counseling next week, 3x/week.  Will push back work start date to allow her to get used to this outpt schedule.  Plan is now to return to work on 2/23.  Will follow.

## 2013-10-21 ENCOUNTER — Telehealth: Payer: Self-pay | Admitting: Family Medicine

## 2013-10-21 ENCOUNTER — Encounter: Payer: Self-pay | Admitting: General Practice

## 2013-10-21 NOTE — Telephone Encounter (Signed)
Relevant patient education assigned to patient using Emmi. ° °

## 2013-10-28 ENCOUNTER — Telehealth: Payer: Self-pay

## 2013-10-28 NOTE — Telephone Encounter (Signed)
The patient called and is hoping to get an update on her paperwork that stated she could be out of work.    Pt Callback- [phone]

## 2013-10-29 ENCOUNTER — Encounter: Payer: Self-pay | Admitting: Family Medicine

## 2013-10-29 NOTE — Telephone Encounter (Signed)
Patient called again to check status of paperwork.

## 2013-10-29 NOTE — Telephone Encounter (Signed)
Spoke with patient who states she needs only office notes (from 10/20/13)  and written work excuse (per OV 10/20/13 ok to remain out of work until 11/02/13)  faxed to [facility]. Patient was unable to locate fax number, I advised her to send it to us via My Chart when she is able to find it. Patient states she will do that.

## 2013-10-30 ENCOUNTER — Encounter: Payer: Self-pay | Admitting: General Practice

## 2013-10-30 NOTE — Telephone Encounter (Signed)
Received some paperwork today from [employer] in regards to work status. Letter written as requested and faxed with OV note to [phone].

## 2013-11-02 ENCOUNTER — Encounter: Payer: Self-pay | Admitting: Family Medicine

## 2013-11-17 ENCOUNTER — Ambulatory Visit: Payer: BC Managed Care – PPO | Admitting: Family Medicine

## 2013-11-17 DIAGNOSIS — Z0289 Encounter for other administrative examinations: Secondary | ICD-10-CM

## 2013-11-19 ENCOUNTER — Ambulatory Visit: Payer: BC Managed Care – PPO | Admitting: Family Medicine

## 2014-06-09 ENCOUNTER — Telehealth: Payer: Self-pay | Admitting: Family Medicine

## 2014-06-09 MED ORDER — HYDROCHLOROTHIAZIDE 12.5 MG PO CAPS: 12.5000 mg | ORAL_CAPSULE | Freq: Every day | ORAL | Status: DC

## 2014-06-09 NOTE — Telephone Encounter (Signed)
Caller name: Haze Rushinganeisha  Relation to pt: self  Call back number: [phone] [facility] [address], [place], [place] [address]  [phone] *New Pharmacy   Reason for call:   requesting refill hydrochlorothiazide (MICROZIDE) 12.5 MG capsule patient completely out . Please add new pharamcy patient [facility] [address], [place], [place] [address]  [phone]

## 2014-06-09 NOTE — Telephone Encounter (Signed)
Med filled.  

## 2014-06-25 ENCOUNTER — Other Ambulatory Visit: Payer: Self-pay

## 2014-07-12 ENCOUNTER — Encounter: Payer: Self-pay | Admitting: Family Medicine

## 2014-09-28 IMAGING — US US OB DETAIL+14 WK
1 series · 12 of 28 positions shown · non-contrast
Comparison: none

[Series 1: us ob detail+14 wk · 0.19mm/px · 12 of 102 slices shown]
[im 4/102]
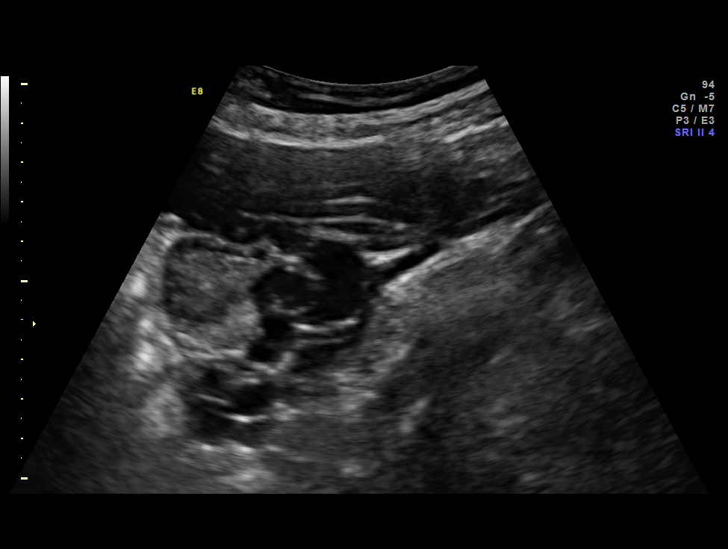
[im 12/102]
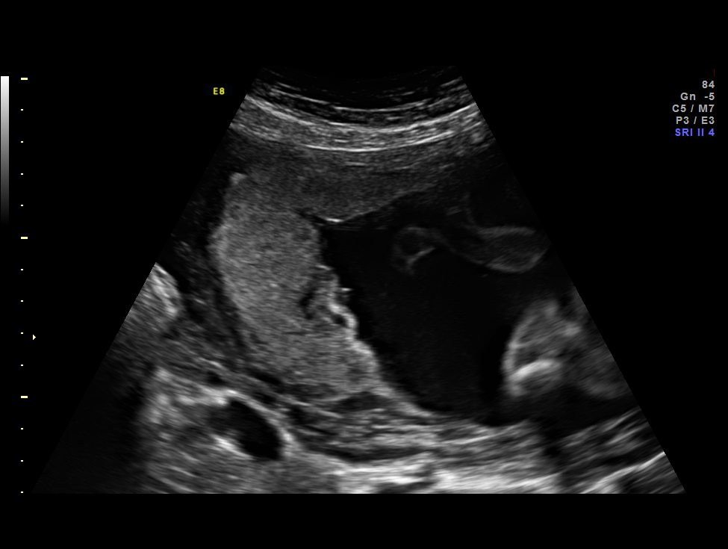
[im 19/102]
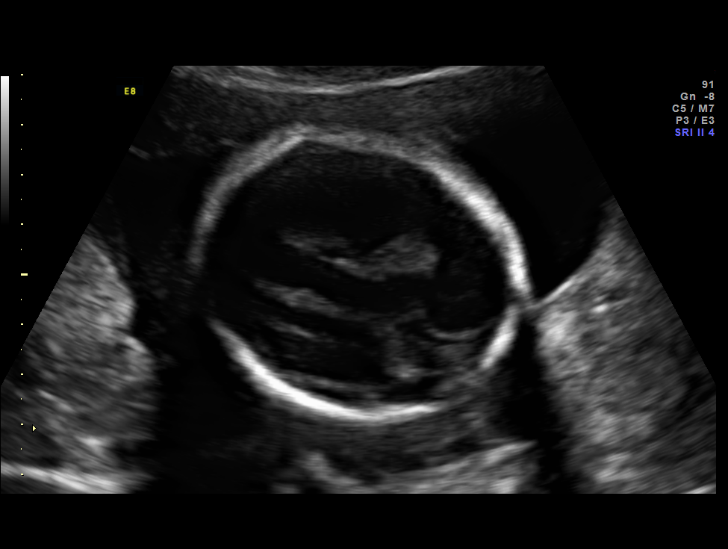
[im 30/102]
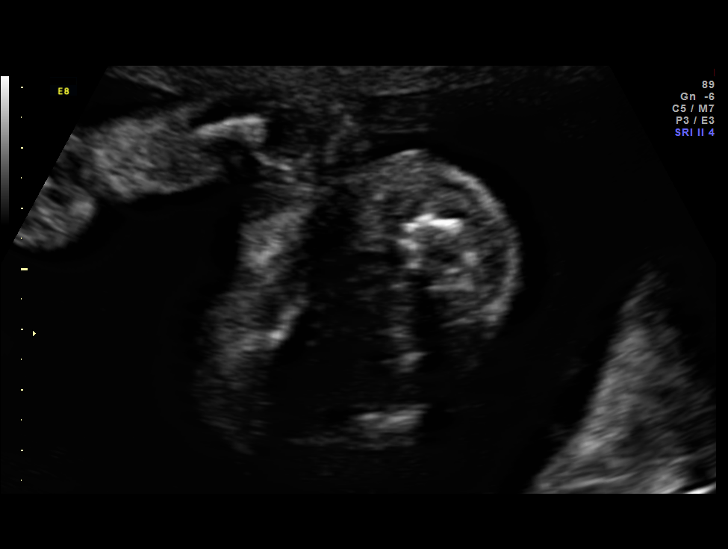
[im 38/102]
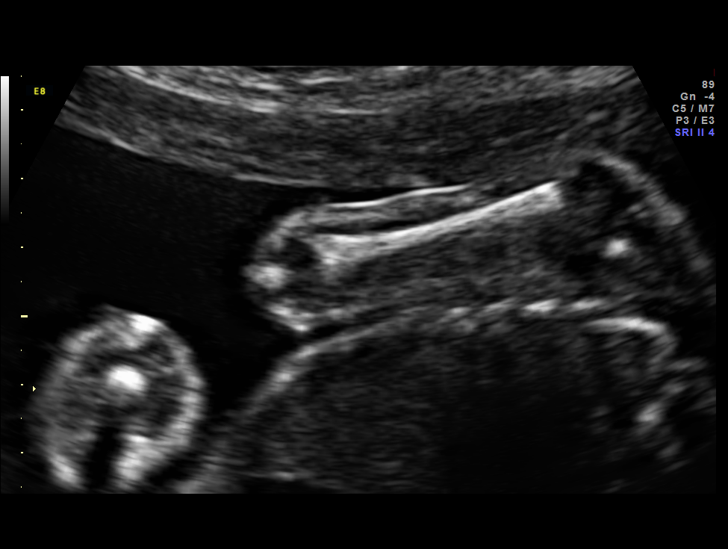
[im 45/102]
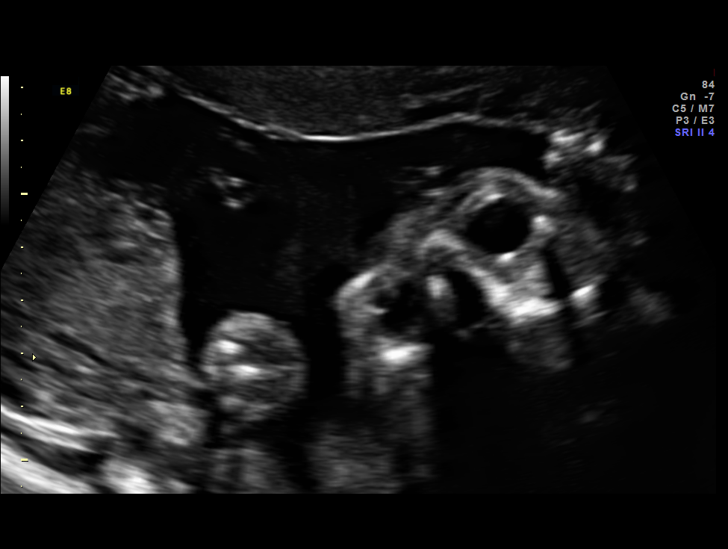
[im 57/102]
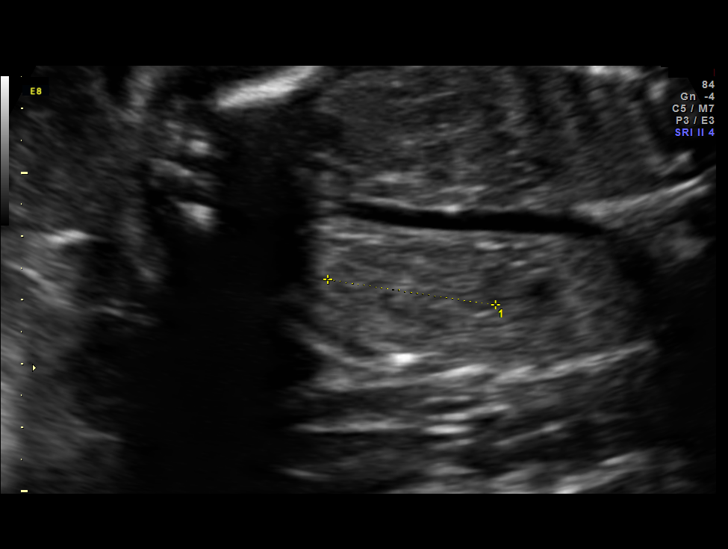
[im 64/102]
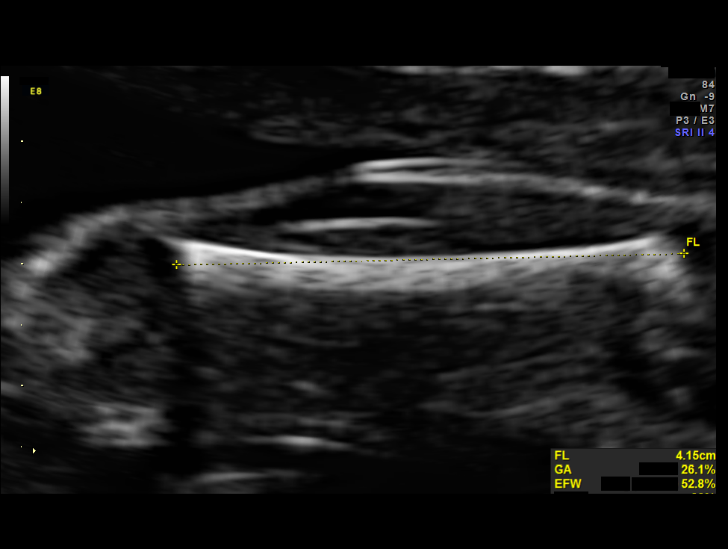
[im 72/102]
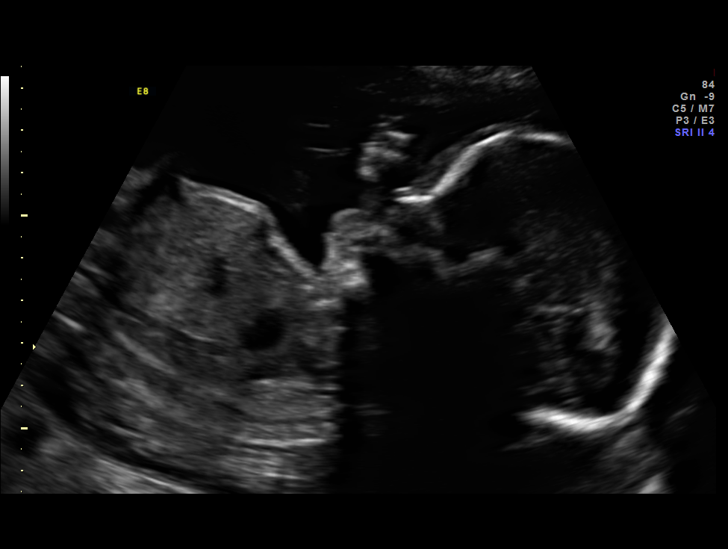
[im 83/102]
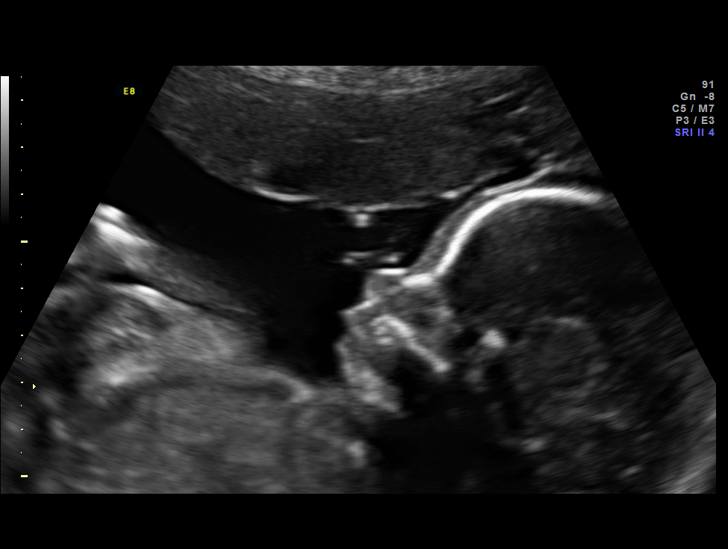
[im 90/102]
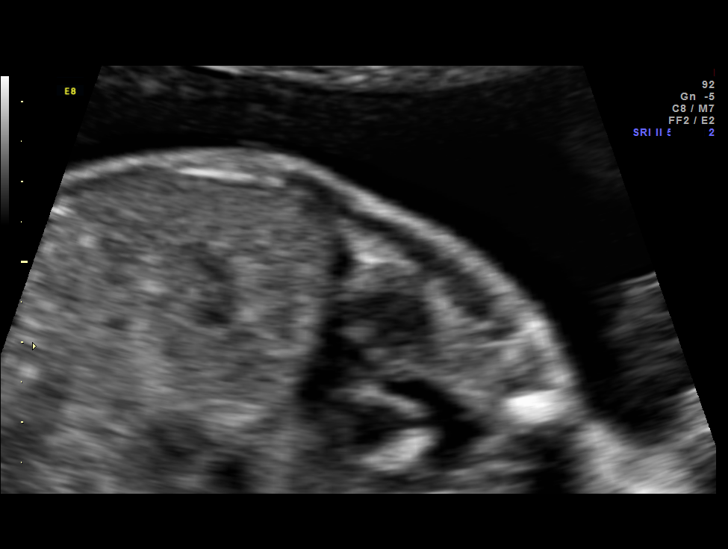
[im 98/102]
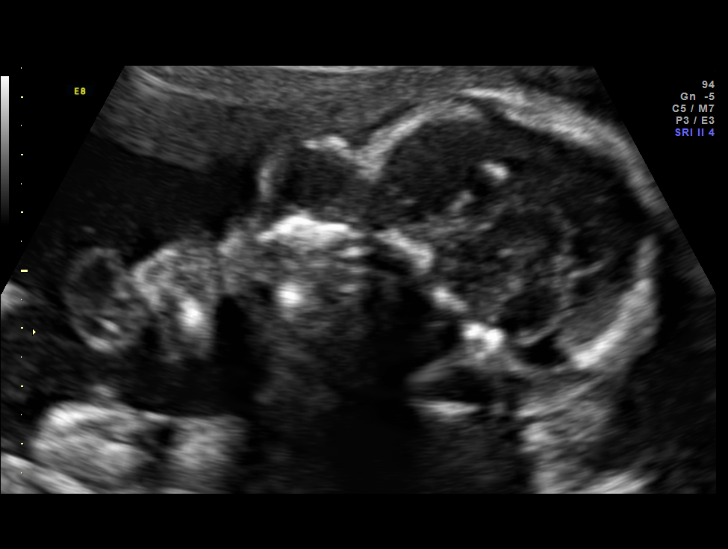

[12 of 28 positions shown; findings below may reference images not displayed]

OBSTETRICS REPORT
                      (Signed Final 08/04/2012 [DATE])

Service(s) Provided

 US OB DETAIL + 14 WK                                  76811.0
Indications

 Poor obstetric history: Previous midtrimester loss
 (14 & 26 week demise)
 Detailed fetal anatomic survey
 Poor obstetric history: Previous IUFD (stillbirth)
Fetal Evaluation

 Num Of Fetuses:    1
 Fetal Heart Rate:  145                          bpm
 Cardiac Activity:  Observed
 Presentation:      Cephalic
 Placenta:          Posterior, above cervical
                    os
 P. Cord            Visualized
 Insertion:

 Amniotic Fluid
 AFI FV:      Subjectively within normal limits
                                             Larg Pckt:     5.7  cm
Biometry

 BPD:       60  mm     G. Age:  24w 4d                CI:         77.7   70 - 86
 OFD:     77.2  mm                                    FL/HC:      19.1   18.7 -

 HC:     218.8  mm     G. Age:  23w 6d       35  %    HC/AC:      1.11   1.05 -

 AC:       197  mm     G. Age:  24w 3d       57  %    FL/BPD:     69.5   71 - 87
 FL:      41.7  mm     G. Age:  23w 4d       28  %    FL/AC:      21.2   20 - 24
 HUM:     37.4  mm     G. Age:  23w 1d       25  %
 CER:     27.8  mm     G. Age:  24w 6d       73  %

 Est. FW:     654  gm      1 lb 7 oz     55  %
Gestational Age

 LMP:           23w 6d        Date:  02/19/12                 EDD:   11/25/12
 U/S Today:     24w 1d                                        EDD:   11/23/12
 Best:          23w 6d     Det. By:  LMP  (02/19/12)          EDD:   11/25/12
Anatomy

 Cranium:          Appears normal         Aortic Arch:      Appears normal
 Fetal Cavum:      Appears normal         Ductal Arch:      Appears normal
 Ventricles:       Appears normal         Diaphragm:        Appears normal
 Choroid Plexus:   Appears normal         Stomach:          Appears normal, left
                                                            sided
 Cerebellum:       Appears normal         Abdomen:          Appears normal
 Posterior Fossa:  Appears normal         Abdominal Wall:   Appears nml (cord
                                                            insert, abd wall)
 Nuchal Fold:      Not applicable (>20    Cord Vessels:     Appears normal (3
                   wks GA)                                  vessel cord)
 Face:             Appears normal         Kidneys:          Appear normal
                   (orbits and profile)
 Lips:             Appears normal         Bladder:          Appears normal
 Palate:           Appears normal         Spine:            Appears normal
 Heart:            Appears normal         Lower             Appears normal
                   (4CH, axis, and        Extremities:
                   situs)
 RVOT:             Appears normal         Upper             Appears normal
                                          Extremities:
 LVOT:             Appears normal

 Other:  Fetus appears to be a female. Heels and 5th digit appear normal.
Cervix Uterus Adnexa

 Cervical Length:    3.7      cm

 Cervix:       Normal appearance by transabdominal scan. Appears
               closed, without funnelling.
 Left Ovary:    Within normal limits.
 Right Ovary:   Within normal limits.
Comments

 Biometry is appropriate for gestational age.
 Amniotic fluid volume is normal.
 Screening survey of the fetal anatomy was performed and no
 dysmorphic features are detected.
Impression

 Active singleton fetus
 Normal anatomic survey
 H/o IUFD at 26 wks in previous gestation.
Recommendations

 1. I recommend interval growth every 4 weeks;
 2. I recommend twice weekly NST with weekly AFI beginning
 at around 32 weeks.
 3. Elective induction may be considered at 39 weeks
 gestational age.

## 2014-10-16 ENCOUNTER — Other Ambulatory Visit: Payer: Self-pay | Admitting: Family Medicine

## 2014-10-18 NOTE — Telephone Encounter (Signed)
Med filled.  

## 2014-11-08 ENCOUNTER — Encounter: Payer: Self-pay | Admitting: Family Medicine

## 2014-11-08 ENCOUNTER — Ambulatory Visit (INDEPENDENT_AMBULATORY_CARE_PROVIDER_SITE_OTHER): Payer: BLUE CROSS/BLUE SHIELD | Admitting: Family Medicine

## 2014-11-08 VITALS — BP 124/78 | HR 86 | Temp 98.3°F | Resp 16 | Wt 147.5 lb

## 2014-11-08 DIAGNOSIS — I10 Essential (primary) hypertension: Secondary | ICD-10-CM

## 2014-11-08 MED ORDER — HYDROCHLOROTHIAZIDE 12.5 MG PO CAPS: 12.5000 mg | ORAL_CAPSULE | Freq: Every day | ORAL | Status: DC

## 2014-11-08 NOTE — Progress Notes (Signed)
Pre visit review using our clinic review tool, if applicable. No additional management support is needed unless otherwise documented below in the visit note. 

## 2014-11-08 NOTE — Assessment & Plan Note (Signed)
Chronic problem.  Well controlled when taking meds.  Check labs.  No anticipated med changes.

## 2014-11-08 NOTE — Progress Notes (Signed)
   Subjective:    Patient ID: [name], female    DOB: 02-24-1981, 34 y.o.   MRN: [id number]  HPI HTN- chronic problem, on HCTZ.  Pt reports 'as long as I take it, I'm fine'.  If not on meds will start swelling, have 'light headedness'.  No CP, SOB, HAs, visual changes.   Review of Systems For ROS see HPI     Objective:   Physical Exam  Constitutional: She is oriented to person, place, and time. She appears well-developed and well-nourished. No distress.  HENT:  Head: Normocephalic and atraumatic.  Eyes: Conjunctivae and EOM are normal. Pupils are equal, round, and reactive to light.  Neck: Normal range of motion. Neck supple. No thyromegaly present.  Cardiovascular: Normal rate, regular rhythm, normal heart sounds and intact distal pulses.   No murmur heard. Pulmonary/Chest: Effort normal and breath sounds normal. No respiratory distress.  Abdominal: Soft. She exhibits no distension. There is no tenderness.  Musculoskeletal: She exhibits no edema.  Lymphadenopathy:    She has no cervical adenopathy.  Neurological: She is alert and oriented to person, place, and time.  Skin: Skin is warm and dry.  Psychiatric: She has a normal mood and affect. Her behavior is normal.  Vitals reviewed.         Assessment & Plan:

## 2014-11-08 NOTE — Patient Instructions (Signed)
Schedule your complete physical in 6 months We'll notify you of your lab results and make any changes if needed Keep up the good work!  You look great! Call with any questions or concerns Happy Spring!!! 

## 2014-11-09 LAB — CBC WITH DIFFERENTIAL/PLATELET
BASOS PCT: 0.9 % (ref 0.0–3.0)
Basophils Absolute: 0 10*3/uL (ref 0.0–0.1)
EOS PCT: 2.3 % (ref 0.0–5.0)
Eosinophils Absolute: 0.1 10*3/uL (ref 0.0–0.7)
HEMATOCRIT: 43.4 % (ref 36.0–46.0)
Hemoglobin: 14.7 g/dL (ref 12.0–15.0)
LYMPHS ABS: 1.9 10*3/uL (ref 0.7–4.0)
Lymphocytes Relative: 39.6 % (ref 12.0–46.0)
MCHC: 33.8 g/dL (ref 30.0–36.0)
MCV: 90.7 fl (ref 78.0–100.0)
Monocytes Absolute: 0.2 10*3/uL (ref 0.1–1.0)
Monocytes Relative: 4.5 % (ref 3.0–12.0)
Neutro Abs: 2.6 10*3/uL (ref 1.4–7.7)
Neutrophils Relative %: 52.7 % (ref 43.0–77.0)
Platelets: 228 10*3/uL (ref 150.0–400.0)
RBC: 4.79 Mil/uL (ref 3.87–5.11)
RDW: 14.1 % (ref 11.5–15.5)
WBC: 4.9 10*3/uL (ref 4.0–10.5)

## 2014-11-09 LAB — BASIC METABOLIC PANEL
BUN: 22 mg/dL (ref 6–23)
CO2: 29 meq/L (ref 19–32)
Calcium: 9.4 mg/dL (ref 8.4–10.5)
Chloride: 103 mEq/L (ref 96–112)
Creatinine, Ser: 0.78 mg/dL (ref 0.40–1.20)
GFR: 110.46 mL/min (ref >60.00)
GLUCOSE: 86 mg/dL (ref 70–99)
Potassium: 3.6 mEq/L (ref 3.5–5.1)
Sodium: 137 mEq/L (ref 135–145)

## 2015-05-02 ENCOUNTER — Encounter: Payer: Self-pay | Admitting: Family Medicine

## 2015-05-09 ENCOUNTER — Encounter: Payer: Self-pay | Admitting: Family Medicine

## 2015-07-20 ENCOUNTER — Other Ambulatory Visit: Payer: Self-pay | Admitting: Family Medicine

## 2015-07-20 NOTE — Telephone Encounter (Signed)
Medication filled to pharmacy as requested.   

## 2015-08-30 ENCOUNTER — Other Ambulatory Visit: Payer: Self-pay | Admitting: Family Medicine

## 2015-08-31 NOTE — Telephone Encounter (Signed)
Medication filled to pharmacy as requested.   

## 2015-09-29 LAB — HM PAP SMEAR

## 2015-10-11 ENCOUNTER — Other Ambulatory Visit: Payer: Self-pay | Admitting: Family Medicine

## 2015-10-11 NOTE — Telephone Encounter (Signed)
Medication filled to pharmacy as requested.   

## 2015-11-20 ENCOUNTER — Other Ambulatory Visit: Payer: Self-pay | Admitting: Family Medicine

## 2015-11-21 NOTE — Telephone Encounter (Signed)
Med denied, pt is overdue for a physical.

## 2015-11-24 ENCOUNTER — Telehealth: Payer: Self-pay | Admitting: Family Medicine

## 2015-11-24 MED ORDER — HYDROCHLOROTHIAZIDE 12.5 MG PO CAPS: 12.5000 mg | ORAL_CAPSULE | Freq: Every day | ORAL | Status: DC

## 2015-11-24 NOTE — Telephone Encounter (Signed)
Med was filled, pt has not been seen in over a year can you schedule either a Physical or a BP follow up. Cannot fill meds again without seeing her.

## 2015-11-24 NOTE — Telephone Encounter (Signed)
Relation to ZO:XWRUpt:self Call back number:[phone] [facility]acy: [facility] [address] [place] [address] [phone]) [phone])         Reason for call:  Patient requesting a refill hydrochlorothiazide (MICROZIDE) 12.5 MG capsule

## 2015-11-28 NOTE — Telephone Encounter (Signed)
Patient scheduled 01/13/2016

## 2015-12-27 ENCOUNTER — Other Ambulatory Visit: Payer: Self-pay | Admitting: Family Medicine

## 2015-12-28 NOTE — Telephone Encounter (Signed)
Medication filled to pharmacy as requested.   

## 2016-01-13 ENCOUNTER — Ambulatory Visit (INDEPENDENT_AMBULATORY_CARE_PROVIDER_SITE_OTHER): Payer: Managed Care, Other (non HMO) | Admitting: Family Medicine

## 2016-01-13 ENCOUNTER — Encounter: Payer: Self-pay | Admitting: Family Medicine

## 2016-01-13 VITALS — BP 120/80 | HR 82 | Temp 98.0°F | Resp 16 | Ht 64.0 in | Wt 146.2 lb

## 2016-01-13 DIAGNOSIS — Z0000 Encounter for general adult medical examination without abnormal findings: Secondary | ICD-10-CM

## 2016-01-13 DIAGNOSIS — Z23 Encounter for immunization: Secondary | ICD-10-CM | POA: Diagnosis not present

## 2016-01-13 LAB — BASIC METABOLIC PANEL
BUN: 13 mg/dL (ref 6–23)
CALCIUM: 9.6 mg/dL (ref 8.4–10.5)
CHLORIDE: 99 meq/L (ref 96–112)
CO2: 30 meq/L (ref 19–32)
Creatinine, Ser: 0.71 mg/dL (ref 0.40–1.20)
GFR: 122.21 mL/min (ref >60.00)
GLUCOSE: 80 mg/dL (ref 70–99)
POTASSIUM: 3.4 meq/L — AB (ref 3.5–5.1)
SODIUM: 137 meq/L (ref 135–145)

## 2016-01-13 LAB — CBC WITH DIFFERENTIAL/PLATELET
BASOS ABS: 0 10*3/uL (ref 0.0–0.1)
BASOS PCT: 0.5 % (ref 0.0–3.0)
EOS ABS: 0 10*3/uL (ref 0.0–0.7)
Eosinophils Relative: 1.4 % (ref 0.0–5.0)
HCT: 40.5 % (ref 36.0–46.0)
Hemoglobin: 13.9 g/dL (ref 12.0–15.0)
LYMPHS ABS: 1.5 10*3/uL (ref 0.7–4.0)
Lymphocytes Relative: 48.3 % — ABNORMAL HIGH (ref 12.0–46.0)
MCHC: 34.4 g/dL (ref 30.0–36.0)
MCV: 88.4 fl (ref 78.0–100.0)
MONO ABS: 0.2 10*3/uL (ref 0.1–1.0)
Monocytes Relative: 7.7 % (ref 3.0–12.0)
NEUTROS ABS: 1.3 10*3/uL — AB (ref 1.4–7.7)
NEUTROS PCT: 42.1 % — AB (ref 43.0–77.0)
PLATELETS: 240 10*3/uL (ref 150.0–400.0)
RBC: 4.59 Mil/uL (ref 3.87–5.11)
RDW: 13.7 % (ref 11.5–15.5)
WBC: 3 10*3/uL — ABNORMAL LOW (ref 4.0–10.5)

## 2016-01-13 LAB — LIPID PANEL
CHOLESTEROL: 202 mg/dL — AB (ref 0–200)
HDL: 58.1 mg/dL (ref >39.00)
LDL Cholesterol: 117 mg/dL — ABNORMAL HIGH (ref 0–99)
NonHDL: 143.74
Total CHOL/HDL Ratio: 3
Triglycerides: 133 mg/dL (ref 0.0–149.0)
VLDL: 26.6 mg/dL (ref 0.0–40.0)

## 2016-01-13 LAB — HEPATIC FUNCTION PANEL
ALK PHOS: 79 U/L (ref 39–117)
ALT: 13 U/L (ref 0–35)
AST: 14 U/L (ref 0–37)
Albumin: 4.6 g/dL (ref 3.5–5.2)
BILIRUBIN DIRECT: 0.1 mg/dL (ref 0.0–0.3)
TOTAL PROTEIN: 7.8 g/dL (ref 6.0–8.3)
Total Bilirubin: 0.3 mg/dL (ref 0.2–1.2)

## 2016-01-13 LAB — VITAMIN D 25 HYDROXY (VIT D DEFICIENCY, FRACTURES): VITD: 11.75 ng/mL — ABNORMAL LOW (ref 30.00–100.00)

## 2016-01-13 LAB — TSH: TSH: 1.43 u[IU]/mL (ref 0.35–4.50)

## 2016-01-13 MED ORDER — HYDROCHLOROTHIAZIDE 12.5 MG PO CAPS: 12.5000 mg | ORAL_CAPSULE | Freq: Every day | ORAL | Status: DC

## 2016-01-13 NOTE — Progress Notes (Signed)
   Subjective:    Patient ID: [name], female    DOB: Mar 09, 1981, 35 y.o.   MRN: [id number]  HPI CPE- due for pap smear, plans to do next month ([facility]).   Review of Systems Patient reports no vision/ hearing changes, adenopathy,fever, weight change,  persistant/recurrent hoarseness , swallowing issues, chest pain, palpitations, edema, persistant/recurrent cough, hemoptysis, dyspnea (rest/exertional/paroxysmal nocturnal), gastrointestinal bleeding (melena, rectal bleeding), abdominal pain, significant heartburn, bowel changes, GU symptoms (dysuria, hematuria, incontinence), Gyn symptoms (abnormal  bleeding, pain),  syncope, focal weakness, memory loss, numbness & tingling, skin/hair/nail changes, abnormal bruising or bleeding, anxiety, or depression.     Objective:   Physical Exam . General Appearance:    Alert, cooperative, no distress, appears stated age  Head:    Normocephalic, without obvious abnormality, atraumatic  Eyes:    PERRL, conjunctiva/corneas clear, EOM's intact, fundi    benign, both eyes  Ears:    Normal TM's and external ear canals, both ears  Nose:   Nares normal, septum midline, mucosa normal, no drainage    or sinus tenderness  Throat:   Lips, mucosa, and tongue normal; teeth and gums normal  Neck:   Supple, symmetrical, trachea midline, no adenopathy;    Thyroid: no enlargement/tenderness/nodules  Back:     Symmetric, no curvature, ROM normal, no CVA tenderness  Lungs:     Clear to auscultation bilaterally, respirations unlabored  Chest Wall:    No tenderness or deformity   Heart:    Regular rate and rhythm, S1 and S2 normal, no murmur, rub   or gallop  Breast Exam:    Deferred to GYN  Abdomen:     Soft, non-tender, bowel sounds active all four quadrants,    no masses, no organomegaly  Genitalia:    Deferred to GYN  Rectal:    Extremities:   Extremities normal, atraumatic, no cyanosis or edema  Pulses:   2+ and symmetric all extremities  Skin:    Skin color, texture, turgor normal, no rashes or lesions  Lymph nodes:   Cervical, supraclavicular, and axillary nodes normal  Neurologic:   CNII-XII intact, normal strength, sensation and reflexes    throughout          Assessment & Plan:

## 2016-01-13 NOTE — Assessment & Plan Note (Signed)
Pt's PE WNL.  Due for pap- plans to schedule w/ [facility].  Check labs.  Anticipatory guidance provided.

## 2016-01-13 NOTE — Patient Instructions (Signed)
Follow up in 6 months to recheck blood pressure We'll notify you of your lab results and make any changes if needed Keep up the good work on healthy diet and regular exercise- you look great!! Schedule your pap smear at your convenience Call with any questions or concerns Thanks for sticking with us! Have a great weekend!!!

## 2016-01-13 NOTE — Progress Notes (Signed)
Pre visit review using our clinic review tool, if applicable. No additional management support is needed unless otherwise documented below in the visit note. 

## 2016-01-16 ENCOUNTER — Other Ambulatory Visit: Payer: Self-pay | Admitting: General Practice

## 2016-01-16 MED ORDER — VITAMIN D (ERGOCALCIFEROL) 1.25 MG (50000 UNIT) PO CAPS: 50000.0000 [IU] | ORAL_CAPSULE | ORAL | Status: DC

## 2016-09-04 ENCOUNTER — Other Ambulatory Visit: Payer: Self-pay | Admitting: Family Medicine

## 2016-09-10 NOTE — L&D Delivery Note (Signed)
Delivery Note At  a viable female was delivered via  (Presentation:vertex ;LOA  ).  APGAR:9,9 ; weight  .   Placenta status: spont, shultz.  Cord:3vc  with the following complications: none.  Cord pH: n/a  Anesthesia:  epidural Episiotomy:  none Lacerations:  none Suture Repair: none Est. Blood Loss 350(mL):    Mom to postpartum.  Baby to Couplet care / Skin to Skin.  [name] 09/01/2017, 5:40 PM

## 2016-09-24 ENCOUNTER — Encounter (HOSPITAL_COMMUNITY): Payer: Self-pay | Admitting: Emergency Medicine

## 2016-09-24 ENCOUNTER — Ambulatory Visit (HOSPITAL_COMMUNITY)
Admission: EM | Admit: 2016-09-24 | Discharge: 2016-09-24 | Disposition: A | Discharge status: Home / Self Care | Payer: Managed Care, Other (non HMO) | Attending: Family Medicine | Admitting: Family Medicine

## 2016-09-24 DIAGNOSIS — R51 Headache: Secondary | ICD-10-CM | POA: Diagnosis not present

## 2016-09-24 DIAGNOSIS — R519 Headache, unspecified: Secondary | ICD-10-CM

## 2016-09-24 MED ORDER — ASPIRIN-ACETAMINOPHEN-CAFFEINE 250-250-65 MG PO TABS: 1.0000 | ORAL_TABLET | Freq: Four times a day (QID) | ORAL | 0 refills | Status: DC | PRN

## 2016-09-24 MED ORDER — METOCLOPRAMIDE HCL 5 MG/ML IJ SOLN
INTRAMUSCULAR | Status: AC
  Filled 2016-09-24: qty 2

## 2016-09-24 MED ORDER — KETOROLAC TROMETHAMINE 60 MG/2ML IM SOLN
60.0000 mg | Freq: Once | INTRAMUSCULAR | Status: AC
  Administered 2016-09-24: 60 mg via INTRAMUSCULAR

## 2016-09-24 MED ORDER — DEXAMETHASONE SODIUM PHOSPHATE 10 MG/ML IJ SOLN
10.0000 mg | Freq: Once | INTRAMUSCULAR | Status: AC
  Administered 2016-09-24: 10 mg via INTRAMUSCULAR

## 2016-09-24 MED ORDER — DEXAMETHASONE SODIUM PHOSPHATE 10 MG/ML IJ SOLN
INTRAMUSCULAR | Status: AC
  Filled 2016-09-24: qty 1

## 2016-09-24 MED ORDER — KETOROLAC TROMETHAMINE 60 MG/2ML IM SOLN
INTRAMUSCULAR | Status: AC
  Filled 2016-09-24: qty 2

## 2016-09-24 MED ORDER — METOCLOPRAMIDE HCL 5 MG/ML IJ SOLN
5.0000 mg | Freq: Once | INTRAMUSCULAR | Status: AC
  Administered 2016-09-24: 5 mg via INTRAMUSCULAR

## 2016-09-24 NOTE — ED Triage Notes (Signed)
Headache for 3 days, no cough or cold symptoms

## 2016-09-24 NOTE — ED Provider Notes (Signed)
CSN: [id number]     Arrival date & time 09/24/16  1920 History   First MD Initiated Contact with Patient 09/24/16 2041     Chief Complaint  Patient presents with  . Headache   (Consider location/radiation/quality/duration/timing/severity/associated sxs/prior Treatment) HPI [name] is a 36 y.o. female presenting to UC with c/o 3 days of a generalized headache that was gradual in onset.  Pain is 8/10. Denies cough, congestion, sore throat, fever, chills, n/v/d. She has not felt like eating or drinking much since onset of headache. No increased stress or decreased sleep. No change in medications.  No hx of migraines. Denies neck pain or stiffness. Denies change in vision. Denies numbness or tingling in arms or legs.    Past Medical History:  Diagnosis Date  . Abnormal Pap smear   . Hypertension    Past Surgical History:  Procedure Laterality Date  . CERVIX LESION DESTRUCTION     Family History  Problem Relation Age of Onset  . Hypertension Mother   . Mental illness Mother     anxiety  . Hypertension Maternal Grandmother   . Cancer Paternal Grandmother   . Cancer Paternal Grandfather    Social History  Substance Use Topics  . Smoking status: Current Some Day Smoker    Packs/day: 0.50    Years: 12.00    Types: Cigarettes    Last attempt to quit: 06/06/2012  . Smokeless tobacco: Never Used  . Alcohol use No     Comment: socially   OB History    Gravida Para Term Preterm AB Living   5 3 2 1 2 2    SAB TAB Ectopic Multiple Live Births   2       2     Review of Systems  Constitutional: Negative for chills and fever.  HENT: Negative for congestion, ear pain, sore throat, trouble swallowing and voice change.   Eyes: Positive for photophobia. Negative for visual disturbance.  Respiratory: Negative for cough and shortness of breath.   Cardiovascular: Negative for chest pain and palpitations.  Gastrointestinal: Negative for abdominal pain, diarrhea, nausea and vomiting.   Musculoskeletal: Negative for arthralgias, back pain and myalgias.  Skin: Negative for rash.  Neurological: Positive for headaches. Negative for dizziness and light-headedness.    Allergies  Patient has no known allergies.  Home Medications   Prior to Admission medications   Medication Sig Start Date End Date Taking? Authorizing Provider  hydrochlorothiazide (MICROZIDE) 12.5 MG capsule take 1 capsule by mouth once daily 09/04/16  Yes Sheliah Hatch, MD  aspirin-acetaminophen-caffeine (EXCEDRIN MIGRAINE) [phone] MG tablet Take 1-2 tablets by mouth every 6 (six) hours as needed for headache or migraine. 09/24/16   Junius Finner, PA-C  Vitamin D, Ergocalciferol, (DRISDOL) 50000 units CAPS capsule Take 1 capsule (50,000 Units total) by mouth every 7 (seven) days. Patient not taking: Reported on 09/24/2016 01/16/16   Sheliah Hatch, MD   Meds Ordered and Administered this Visit   Medications  ketorolac (TORADOL) injection 60 mg (60 mg Intramuscular Given 09/24/16 2108)  dexamethasone (DECADRON) injection 10 mg (10 mg Intramuscular Given 09/24/16 2102)  metoCLOPramide (REGLAN) injection 5 mg (5 mg Intramuscular Given 09/24/16 2105)    BP 95/68 (BP Location: Left Arm)   Pulse 87   Temp 98.2 F (36.8 C) (Oral)   Resp 16   LMP 09/14/2016   SpO2 99%  No data found.   Physical Exam  Urgent Care Course   Clinical Course  Procedures (including critical care time)  Labs Review Labs Reviewed - No data to display  Imaging Review No results found.   MDM   1. Generalized headache    Pt c/o mild generalized HA. No red flag symptoms.  Toradol, Reglan, and Decadron given in UC w/o relief.   Encouraged pt to drink a lot of water and rest in a cool dark room for headache. If pain becomes more severe or new symptoms develop, she should go to ED for further evaluation and treatment. F/u with PCP tomorrow if not improving. Pt declined work note for tomorrow.    Junius Finnerrin  O'Malley, PA-C 09/24/16 2142

## 2016-09-28 ENCOUNTER — Ambulatory Visit (INDEPENDENT_AMBULATORY_CARE_PROVIDER_SITE_OTHER): Payer: Managed Care, Other (non HMO) | Admitting: Family Medicine

## 2016-09-28 ENCOUNTER — Encounter: Payer: Self-pay | Admitting: Family Medicine

## 2016-09-28 VITALS — BP 102/78 | HR 58 | Temp 98.7°F | Resp 16 | Ht 64.0 in | Wt 138.5 lb

## 2016-09-28 DIAGNOSIS — G43011 Migraine without aura, intractable, with status migrainosus: Secondary | ICD-10-CM

## 2016-09-28 DIAGNOSIS — G43009 Migraine without aura, not intractable, without status migrainosus: Secondary | ICD-10-CM | POA: Insufficient documentation

## 2016-09-28 DIAGNOSIS — Z3201 Encounter for pregnancy test, result positive: Secondary | ICD-10-CM | POA: Diagnosis not present

## 2016-09-28 LAB — POCT URINE PREGNANCY: PREG TEST UR: NEGATIVE

## 2016-09-28 MED ORDER — KETOROLAC TROMETHAMINE 60 MG/2ML IM SOLN
60.0000 mg | Freq: Once | INTRAMUSCULAR | Status: AC
  Administered 2016-09-28: 60 mg via INTRAMUSCULAR

## 2016-09-28 MED ORDER — PROMETHAZINE HCL 25 MG PO TABS: 25.0000 mg | ORAL_TABLET | Freq: Three times a day (TID) | ORAL | 0 refills | Status: DC | PRN

## 2016-09-28 MED ORDER — SUMATRIPTAN SUCCINATE 50 MG PO TABS: 50.0000 mg | ORAL_TABLET | Freq: Once | ORAL | 3 refills | Status: DC

## 2016-09-28 NOTE — Patient Instructions (Signed)
Follow up as needed- particularly if headache isn't improving We'll call you with your CT appt Take the [employer] when you get home- you can repeat this in 2 hrs if the headache hasn't improved.  But NO MORE THAN 2 DOSES IN 24 HRS Increase your water intake- this will help w/ headaches Use the Promethazine as needed for nausea If the headaches change or worsen, please let me know! Hang in there!!!

## 2016-09-28 NOTE — Progress Notes (Signed)
Pre visit review using our clinic review tool, if applicable. No additional management support is needed unless otherwise documented below in the visit note. 

## 2016-09-28 NOTE — Assessment & Plan Note (Signed)
New.  Pt has no hx of headaches which makes her new onset of sxs more concerning.  Will get CT scan to assess new onset headaches.  No red flags on hx of PE.  Neuro exam WNL  This may be hormonal as pt did have + Upreg but then had normal cycle.  Start Imitrex as this appears to be consistent w/ migraine.  Toradol injxn given in office.  If no improvement or abnormal CT- will refer to Neuro.  Reviewed supportive care and red flags that should prompt return.  Pt expressed understanding and is in agreement w/ plan.

## 2016-09-28 NOTE — Progress Notes (Signed)
   Subjective:    Patient ID: [name], female    DOB: 1981/08/20, 36 y.o.   MRN: [id number]  HPI Headaches- sxs started Sunday.  No hx of headaches previously.  HAs are frontal and on top of head.  No visual changes.  + nausea.  + photophobia, no phonophobia.  LMP 2 weeks ago.  Pt took home pregnancy test 1/3 and it was positive.  Started period on 1/5.  No longer bleeding.  Pregnancy test here today (-).  Had normal period in December.  Denies sinus pain/pressure.  No ear pain.  HAs improve w/ lying down in a dark room.  No relief w/ excedrin migraine.  HA has been constant 'from the time I wake up until I go to sleep'.  Went to UC on Monday and got 'migraine treatment' w/ some relief on Monday and Tuesday but again woke up w/ HA on Wed, Thurs, Fri.   Review of Systems For ROS see HPI     Objective:   Physical Exam  Constitutional: She is oriented to person, place, and time. She appears well-developed and well-nourished. No distress.  HENT:  Head: Normocephalic and atraumatic.  TMs WNL No TTP over sinuses Minimal nasal congestion  Eyes: Conjunctivae and EOM are normal. Pupils are equal, round, and reactive to light.  Neck: Normal range of motion. Neck supple.  Cardiovascular: Normal rate, regular rhythm, normal heart sounds and intact distal pulses.   Pulmonary/Chest: Effort normal and breath sounds normal. No respiratory distress. She has no wheezes. She has no rales.  Lymphadenopathy:    She has no cervical adenopathy.  Neurological: She is alert and oriented to person, place, and time. She has normal reflexes. No cranial nerve deficit. Coordination normal.  Psychiatric: She has a normal mood and affect. Her behavior is normal. Judgment and thought content normal.  Vitals reviewed.        Assessment & Plan:

## 2016-10-04 ENCOUNTER — Other Ambulatory Visit: Payer: Managed Care, Other (non HMO)

## 2016-10-08 ENCOUNTER — Other Ambulatory Visit: Payer: Self-pay | Admitting: Family Medicine

## 2016-10-11 ENCOUNTER — Other Ambulatory Visit: Payer: Managed Care, Other (non HMO)

## 2017-01-10 ENCOUNTER — Ambulatory Visit (HOSPITAL_COMMUNITY)
Admission: RE | Admit: 2017-01-10 | Discharge: 2017-01-10 | Disposition: A | Discharge status: Home / Self Care | Payer: Managed Care, Other (non HMO) | Source: Ambulatory Visit | Attending: Obstetrics & Gynecology | Admitting: Obstetrics & Gynecology

## 2017-01-10 ENCOUNTER — Other Ambulatory Visit: Payer: Self-pay | Admitting: Advanced Practice Midwife

## 2017-01-10 ENCOUNTER — Ambulatory Visit (INDEPENDENT_AMBULATORY_CARE_PROVIDER_SITE_OTHER): Payer: Managed Care, Other (non HMO) | Admitting: General Practice

## 2017-01-10 ENCOUNTER — Ambulatory Visit: Payer: Self-pay

## 2017-01-10 DIAGNOSIS — O09299 Supervision of pregnancy with other poor reproductive or obstetric history, unspecified trimester: Secondary | ICD-10-CM

## 2017-01-10 DIAGNOSIS — O3680X0 Pregnancy with inconclusive fetal viability, not applicable or unspecified: Secondary | ICD-10-CM

## 2017-01-10 DIAGNOSIS — Z3201 Encounter for pregnancy test, result positive: Secondary | ICD-10-CM

## 2017-01-10 DIAGNOSIS — Z3A01 Less than 8 weeks gestation of pregnancy: Secondary | ICD-10-CM | POA: Insufficient documentation

## 2017-01-10 DIAGNOSIS — O209 Hemorrhage in early pregnancy, unspecified: Secondary | ICD-10-CM | POA: Insufficient documentation

## 2017-01-10 LAB — POCT PREGNANCY, URINE: Preg Test, Ur: POSITIVE — AB

## 2017-01-10 NOTE — Progress Notes (Signed)
Pt informed that the ultrasound is considered a limited OB ultrasound and is not intended to be a complete ultrasound exam.  Patient also informed that the ultrasound is not being completed with the intent of assessing for fetal or placental anomalies or any pelvic abnormalities.  Explained that the purpose of today's ultrasound is to assess for viability.  Patient acknowledges the purpose of the exam and the limitations of the study.    Unable to visualize IUP transabdominally.  Pt will need transvag scan today for confirmation of viability. appt scheduled @ 4pm today.

## 2017-01-10 NOTE — Addendum Note (Signed)
Addended by: Jill SideAY, Taeshaun Rames L on: 01/10/2017 12:03 PM   Modules accepted: Orders

## 2017-01-10 NOTE — Progress Notes (Signed)
Patient here for upt. UPT +. Patient reports first positive home test Sunday. Reports LMP of 3/3 and 3/29. Patient reports usual periods are around the 3rd. States the period on the 29th wasn't normal but was bleeding. EDD 08/17/17 [id number] by 11/10/16 LMP. States she takes hctz every day for her blood pressure and is already on PNV. Per Dr Debroah LoopArnold HCTZ is okay in 1st trimester. Patient informed and discussed Diane performing ultrasound to confirm best dating. Patient verbalized understanding and will wait in lobby. Patient requests to start care here since she went here for previous pregnancy. Patient has no other questions

## 2017-01-10 NOTE — Progress Notes (Signed)
: [id number]: G5P2122 @ 5 weeks by LMP [facility]. Office closed so pt sent to MAU for US results.   Pt denies pain or bleeding today.    O: VS reviewed, nursing note reviewed,  Constitutional: well developed, well nourished, no distress HEENT: normocephalic CV: normal rate Pulm/chest wall: normal effort Abdomen: soft Neuro: alert and oriented x 3 Skin: warm, dry Psych: affect normal  [place] Ob Comp Less 14 Wks  Result Date: 01/10/2017 CLINICAL DATA:  Inconclusive viability EXAM: OBSTETRIC <14 WK US AND TRANSVAGINAL OB US TECHNIQUE: Both transabdominal and transvaginal ultrasound examinations were performed for complete evaluation of the gestation as well as the maternal uterus, adnexal regions, and pelvic cul-de-sac. Transvaginal technique was performed to assess early pregnancy. COMPARISON:  None. FINDINGS: Intrauterine gestational sac: Single Yolk sac:  Visualized Embryo:  Not visualized Cardiac Activity: Not visualized Heart Rate:   bpm MSD: 6  mm   5 w   2  d CRL:    mm    w    d                  US EDC: Subchorionic hemorrhage:  Small subchorionic hemorrhage Maternal uterus/adnexae: No adnexal masses or free fluid. IMPRESSION: Five week 2 day intrauterine pregnancy by mean sac diameter. No fetal pole currently. This could be followed with repeat ultrasound in 2 weeks to ensure expected progression. Small subchorionic hemorrhage. Electronically Signed   By: Charlett NoseKevin  Dover M.D.   On: 01/10/2017 16:05   [place] Ob Transvaginal  Result Date: 01/10/2017 CLINICAL DATA:  Inconclusive viability EXAM: OBSTETRIC <14 WK US AND TRANSVAGINAL OB US TECHNIQUE: Both transabdominal and transvaginal ultrasound examinations were performed for complete evaluation of the gestation as well as the maternal uterus, adnexal regions, and pelvic cul-de-sac. Transvaginal technique was performed to assess early pregnancy. COMPARISON:  None. FINDINGS: Intrauterine gestational sac: Single Yolk sac:  Visualized  Embryo:  Not visualized Cardiac Activity: Not visualized Heart Rate:   bpm MSD: 6  mm   5 w   2  d CRL:    mm    w    d                  US EDC: Subchorionic hemorrhage:  Small subchorionic hemorrhage Maternal uterus/adnexae: No adnexal masses or free fluid. IMPRESSION: Five week 2 day intrauterine pregnancy by mean sac diameter. No fetal pole currently. This could be followed with repeat ultrasound in 2 weeks to ensure expected progression. Small subchorionic hemorrhage. Electronically Signed   By: Charlett NoseKevin  Dover M.D.   On: 01/10/2017 16:05    A: Early IUP on US  P: US confirms IUP today, c/w with EDC by LMP.  Outpatient US ordered for viability in 2 weeks. Pt to f/u in office for prenatal care as scheduled.  Sharen CounterLisa Leftwich-Kirby, CNM 5:40 PM

## 2017-01-10 NOTE — Addendum Note (Signed)
Addended by: Kathee DeltonHILLMAN, CARRIE L on: 01/10/2017 12:04 PM   Modules accepted: Orders

## 2017-01-14 NOTE — Progress Notes (Signed)
She has a yolk sac and gestational sac.  Will need reevaluation when she comes in at her new OB; should be around 9 weeks by then. Will do bedside scan prior to full appointment exam

## 2017-01-22 ENCOUNTER — Ambulatory Visit: Payer: Managed Care, Other (non HMO)

## 2017-01-22 ENCOUNTER — Ambulatory Visit (HOSPITAL_COMMUNITY)
Admission: RE | Admit: 2017-01-22 | Discharge: 2017-01-22 | Disposition: A | Discharge status: Home / Self Care | Payer: Managed Care, Other (non HMO) | Source: Ambulatory Visit | Attending: Advanced Practice Midwife | Admitting: Advanced Practice Midwife

## 2017-01-22 DIAGNOSIS — Z712 Person consulting for explanation of examination or test findings: Secondary | ICD-10-CM

## 2017-01-22 DIAGNOSIS — O468X1 Other antepartum hemorrhage, first trimester: Secondary | ICD-10-CM | POA: Insufficient documentation

## 2017-01-22 DIAGNOSIS — O3680X0 Pregnancy with inconclusive fetal viability, not applicable or unspecified: Secondary | ICD-10-CM | POA: Diagnosis present

## 2017-01-22 DIAGNOSIS — O09299 Supervision of pregnancy with other poor reproductive or obstetric history, unspecified trimester: Secondary | ICD-10-CM | POA: Diagnosis present

## 2017-01-22 DIAGNOSIS — Z3A01 Less than 8 weeks gestation of pregnancy: Secondary | ICD-10-CM | POA: Diagnosis not present

## 2017-01-22 DIAGNOSIS — O09291 Supervision of pregnancy with other poor reproductive or obstetric history, first trimester: Secondary | ICD-10-CM | POA: Diagnosis not present

## 2017-01-22 NOTE — Progress Notes (Signed)
Pt here today of OB US results.  Pt informed that US showed that she currently has a good pregnancy and that she needs to be scheduled for prenatal care.  Pt stated that she has an appt scheduled with [facility] on 02/12/17.  Pt had no further questions.

## 2017-01-26 ENCOUNTER — Inpatient Hospital Stay (HOSPITAL_COMMUNITY)
Admission: AD | Admit: 2017-01-26 | Discharge: 2017-01-26 | Disposition: A | Discharge status: Home / Self Care | Payer: Managed Care, Other (non HMO) | Source: Ambulatory Visit | Attending: Obstetrics and Gynecology | Admitting: Obstetrics and Gynecology

## 2017-01-26 ENCOUNTER — Encounter (HOSPITAL_COMMUNITY): Payer: Self-pay | Admitting: *Deleted

## 2017-01-26 DIAGNOSIS — Z3A01 Less than 8 weeks gestation of pregnancy: Secondary | ICD-10-CM | POA: Diagnosis not present

## 2017-01-26 DIAGNOSIS — Z3490 Encounter for supervision of normal pregnancy, unspecified, unspecified trimester: Secondary | ICD-10-CM

## 2017-01-26 DIAGNOSIS — O209 Hemorrhage in early pregnancy, unspecified: Secondary | ICD-10-CM | POA: Diagnosis present

## 2017-01-26 LAB — URINALYSIS, ROUTINE W REFLEX MICROSCOPIC
Bilirubin Urine: NEGATIVE
Glucose, UA: NEGATIVE mg/dL
Hgb urine dipstick: NEGATIVE
KETONES UR: NEGATIVE mg/dL
Nitrite: NEGATIVE
Protein, ur: 30 mg/dL — AB
SPECIFIC GRAVITY, URINE: 1.028 (ref 1.005–1.030)
pH: 5 (ref 5.0–8.0)

## 2017-01-26 LAB — WET PREP, GENITAL
CLUE CELLS WET PREP: NONE SEEN
SPERM: NONE SEEN
TRICH WET PREP: NONE SEEN
YEAST WET PREP: NONE SEEN

## 2017-01-26 LAB — POCT PREGNANCY, URINE: Preg Test, Ur: POSITIVE — AB

## 2017-01-26 LAB — OB RESULTS CONSOLE GC/CHLAMYDIA: Gonorrhea: NEGATIVE

## 2017-01-26 NOTE — Discharge Instructions (Signed)
First Trimester of Pregnancy The first trimester of pregnancy is from week 1 until the end of week 13 (months 1 through 3). A week after a sperm fertilizes an egg, the egg will implant on the wall of the uterus. This embryo will begin to develop into a baby. Genes from you and your partner will form the baby. The female genes will determine whether the baby will be a boy or a girl. At 6-8 weeks, the eyes and face will be formed, and the heartbeat can be seen on ultrasound. At the end of 12 weeks, all the baby's organs will be formed. Now that you are pregnant, you will want to do everything you can to have a healthy baby. Two of the most important things are to get good prenatal care and to follow your health care provider's instructions. Prenatal care is all the medical care you receive before the baby's birth. This care will help prevent, find, and treat any problems during the pregnancy and childbirth. Body changes during your first trimester Your body goes through many changes during pregnancy. The changes vary from woman to woman.  You may gain or lose a couple of pounds at first.  You may feel sick to your stomach (nauseous) and you may throw up (vomit). If the vomiting is uncontrollable, call your health care provider.  You may tire easily.  You may develop headaches that can be relieved by medicines. All medicines should be approved by your health care provider.  You may urinate more often. Painful urination may mean you have a bladder infection.  You may develop heartburn as a result of your pregnancy.  You may develop constipation because certain hormones are causing the muscles that push stool through your intestines to slow down.  You may develop hemorrhoids or swollen veins (varicose veins).  Your breasts may begin to grow larger and become tender. Your nipples may stick out more, and the tissue that surrounds them (areola) may become darker.  Your gums may bleed and may be  sensitive to brushing and flossing.  Dark spots or blotches (chloasma, mask of pregnancy) may develop on your face. This will likely fade after the baby is born.  Your menstrual periods will stop.  You may have a loss of appetite.  You may develop cravings for certain kinds of food.  You may have changes in your emotions from day to day, such as being excited to be pregnant or being concerned that something may go wrong with the pregnancy and baby.  You may have more vivid and strange dreams.  You may have changes in your hair. These can include thickening of your hair, rapid growth, and changes in texture. Some women also have hair loss during or after pregnancy, or hair that feels dry or thin. Your hair will most likely return to normal after your baby is born.  What to expect at prenatal visits During a routine prenatal visit:  You will be weighed to make sure you and the baby are growing normally.  Your blood pressure will be taken.  Your abdomen will be measured to track your baby's growth.  The fetal heartbeat will be listened to between weeks 10 and 14 of your pregnancy.  Test results from any previous visits will be discussed.  Your health care provider may ask you:  How you are feeling.  If you are feeling the baby move.  If you have had any abnormal symptoms, such as leaking fluid, bleeding, severe headaches,   or abdominal cramping.  If you are using any tobacco products, including cigarettes, chewing tobacco, and electronic cigarettes.  If you have any questions.  Other tests that may be performed during your first trimester include:  Blood tests to find your blood type and to check for the presence of any previous infections. The tests will also be used to check for low iron levels (anemia) and protein on red blood cells (Rh antibodies). Depending on your risk factors, or if you previously had diabetes during pregnancy, you may have tests to check for high blood  sugar that affects pregnant women (gestational diabetes).  Urine tests to check for infections, diabetes, or protein in the urine.  An ultrasound to confirm the proper growth and development of the baby.  Fetal screens for spinal cord problems (spina bifida) and Down syndrome.  HIV (human immunodeficiency virus) testing. Routine prenatal testing includes screening for HIV, unless you choose not to have this test.  You may need other tests to make sure you and the baby are doing well.  Follow these instructions at home: Medicines  Follow your health care provider's instructions regarding medicine use. Specific medicines may be either safe or unsafe to take during pregnancy.  Take a prenatal vitamin that contains at least 600 micrograms (mcg) of folic acid.  If you develop constipation, try taking a stool softener if your health care provider approves. Eating and drinking  Eat a balanced diet that includes fresh fruits and vegetables, whole grains, good sources of protein such as meat, eggs, or tofu, and low-fat dairy. Your health care provider will help you determine the amount of weight gain that is right for you.  Avoid raw meat and uncooked cheese. These carry germs that can cause birth defects in the baby.  Eating four or five small meals rather than three large meals a day may help relieve nausea and vomiting. If you start to feel nauseous, eating a few soda crackers can be helpful. Drinking liquids between meals, instead of during meals, also seems to help ease nausea and vomiting.  Limit foods that are high in fat and processed sugars, such as fried and sweet foods.  To prevent constipation: ? Eat foods that are high in fiber, such as fresh fruits and vegetables, whole grains, and beans. ? Drink enough fluid to keep your urine clear or pale yellow. Activity  Exercise only as directed by your health care provider. Most women can continue their usual exercise routine during  pregnancy. Try to exercise for 30 minutes at least 5 days a week. Exercising will help you: ? Control your weight. ? Stay in shape. ? Be prepared for labor and delivery.  Experiencing pain or cramping in the lower abdomen or lower back is a good sign that you should stop exercising. Check with your health care provider before continuing with normal exercises.  Try to avoid standing for long periods of time. Move your legs often if you must stand in one place for a long time.  Avoid heavy lifting.  Wear low-heeled shoes and practice good posture.  You may continue to have sex unless your health care provider tells you not to. Relieving pain and discomfort  Wear a good support bra to relieve breast tenderness.  Take warm sitz baths to soothe any pain or discomfort caused by hemorrhoids. Use hemorrhoid cream if your health care provider approves.  Rest with your legs elevated if you have leg cramps or low back pain.  If you develop   varicose veins in your legs, wear support hose. Elevate your feet for 15 minutes, 3-4 times a day. Limit salt in your diet. Prenatal care  Schedule your prenatal visits by the twelfth week of pregnancy. They are usually scheduled monthly at first, then more often in the last 2 months before delivery.  Write down your questions. Take them to your prenatal visits.  Keep all your prenatal visits as told by your health care provider. This is important. Safety  Wear your seat belt at all times when driving.  Make a list of emergency phone numbers, including numbers for family, friends, the hospital, and police and fire departments. General instructions  Ask your health care provider for a referral to a local prenatal education class. Begin classes no later than the beginning of month 6 of your pregnancy.  Ask for help if you have counseling or nutritional needs during pregnancy. Your health care provider can offer advice or refer you to specialists for help  with various needs.  Do not use hot tubs, steam rooms, or saunas.  Do not douche or use tampons or scented sanitary pads.  Do not cross your legs for long periods of time.  Avoid cat litter boxes and soil used by cats. These carry germs that can cause birth defects in the baby and possibly loss of the fetus by miscarriage or stillbirth.  Avoid all smoking, herbs, alcohol, and medicines not prescribed by your health care provider. Chemicals in these products affect the formation and growth of the baby.  Do not use any products that contain nicotine or tobacco, such as cigarettes and e-cigarettes. If you need help quitting, ask your health care provider. You may receive counseling support and other resources to help you quit.  Schedule a dentist appointment. At home, brush your teeth with a soft toothbrush and be gentle when you floss. Contact a health care provider if:  You have dizziness.  You have mild pelvic cramps, pelvic pressure, or nagging pain in the abdominal area.  You have persistent nausea, vomiting, or diarrhea.  You have a bad smelling vaginal discharge.  You have pain when you urinate.  You notice increased swelling in your face, hands, legs, or ankles.  You are exposed to fifth disease or chickenpox.  You are exposed to German measles (rubella) and have never had it. Get help right away if:  You have a fever.  You are leaking fluid from your vagina.  You have spotting or bleeding from your vagina.  You have severe abdominal cramping or pain.  You have rapid weight gain or loss.  You vomit blood or material that looks like coffee grounds.  You develop a severe headache.  You have shortness of breath.  You have any kind of trauma, such as from a fall or a car accident. Summary  The first trimester of pregnancy is from week 1 until the end of week 13 (months 1 through 3).  Your body goes through many changes during pregnancy. The changes vary from  woman to woman.  You will have routine prenatal visits. During those visits, your health care provider will examine you, discuss any test results you may have, and talk with you about how you are feeling. This information is not intended to replace advice given to you by your health care provider. Make sure you discuss any questions you have with your health care provider. Document Released: 08/21/2001 Document Revised: 08/08/2016 Document Reviewed: 08/08/2016 Elsevier Interactive Patient Education  2017 [employer]   Inc.  

## 2017-01-26 NOTE — MAU Provider Note (Signed)
History   G6 P2122 @ 7.2 wks in with c/o spotting today. Denies any pain.   CSN: [id number]  Arrival date & time 01/26/17  1816   None     Chief Complaint  Patient presents with  . Vaginal Bleeding    HPI  Past Medical History:  Diagnosis Date  . Abnormal Pap smear   . Hypertension     Past Surgical History:  Procedure Laterality Date  . CERVIX LESION DESTRUCTION      Family History  Problem Relation Age of Onset  . Hypertension Mother   . Mental illness Mother        anxiety  . Hypertension Maternal Grandmother   . Cancer Paternal Grandmother   . Cancer Paternal Grandfather     Social History  Substance Use Topics  . Smoking status: Current Some Day Smoker    Packs/day: 0.50    Years: 12.00    Types: Cigarettes    Last attempt to quit: 06/06/2012  . Smokeless tobacco: Never Used  . Alcohol use No     Comment: socially    OB History    Gravida Para Term Preterm AB Living   6 3 2 1 2 2    SAB TAB Ectopic Multiple Live Births   2       2      Review of Systems  Constitutional: Negative.   HENT: Negative.   Eyes: Negative.   Respiratory: Negative.   Cardiovascular: Negative.   Gastrointestinal: Negative.   Endocrine: Negative.   Genitourinary: Positive for vaginal bleeding.  Musculoskeletal: Negative.   Skin: Negative.     Allergies  Patient has no known allergies.  Home Medications    BP 103/64 (BP Location: Right Arm)   Pulse 96   Temp 98.2 F (36.8 C) (Oral)   Resp 16   LMP 12/06/2016   Physical Exam  Constitutional: She is oriented to person, place, and time. She appears well-developed and well-nourished.  HENT:  Head: Normocephalic.  Eyes: Pupils are equal, round, and reactive to light.  Neck: Normal range of motion.  Cardiovascular: Normal rate, regular rhythm, normal heart sounds and intact distal pulses.   Pulmonary/Chest: Effort normal and breath sounds normal.  Abdominal: Soft. Bowel sounds are normal.  Genitourinary:  Vagina normal and uterus normal.  Musculoskeletal: Normal range of motion.  Neurological: She is alert and oriented to person, place, and time. She has normal reflexes.  Skin: Skin is warm and dry.  Psychiatric: She has a normal mood and affect. Her behavior is normal. Judgment and thought content normal.    MAU Course  Procedures (including critical care time)  Labs Reviewed  URINALYSIS, ROUTINE W REFLEX MICROSCOPIC - Abnormal; Notable for the following:       Result Value   APPearance CLOUDY (*)    Protein, ur 30 (*)    Leukocytes, UA TRACE (*)    Bacteria, UA RARE (*)    Squamous Epithelial / LPF TOO NUMEROUS TO COUNT (*)    All other components within normal limits  POCT PREGNANCY, URINE - Abnormal; Notable for the following:    Preg Test, Ur POSITIVE (*)    All other components within normal limits  WET PREP, GENITAL  GC/CHLAMYDIA PROBE AMP ([place]) NOT AT [facility]   No results found.   No diagnosis found.    MDM  Bedside US shows viable IUP with strong FHR. Wet prep neg . GC and chla done not vag bleeding at all  noted with exam. Will d/c home

## 2017-01-26 NOTE — MAU Note (Signed)
Patient just found out pregnant, started spotting, was having breast tenderness, now they aren't tender, spotting started today. Intercourse 3 days, lower back discomfort

## 2017-01-28 LAB — GC/CHLAMYDIA PROBE AMP (~~LOC~~) NOT AT ARMC
Chlamydia: NEGATIVE
Neisseria Gonorrhea: NEGATIVE

## 2017-02-12 ENCOUNTER — Encounter: Payer: Self-pay | Admitting: Obstetrics & Gynecology

## 2017-02-12 ENCOUNTER — Ambulatory Visit (INDEPENDENT_AMBULATORY_CARE_PROVIDER_SITE_OTHER): Payer: Managed Care, Other (non HMO) | Admitting: Obstetrics & Gynecology

## 2017-02-12 DIAGNOSIS — Z3480 Encounter for supervision of other normal pregnancy, unspecified trimester: Secondary | ICD-10-CM

## 2017-02-12 DIAGNOSIS — O09899 Supervision of other high risk pregnancies, unspecified trimester: Secondary | ICD-10-CM | POA: Insufficient documentation

## 2017-02-12 DIAGNOSIS — O10011 Pre-existing essential hypertension complicating pregnancy, first trimester: Secondary | ICD-10-CM

## 2017-02-12 DIAGNOSIS — O10019 Pre-existing essential hypertension complicating pregnancy, unspecified trimester: Secondary | ICD-10-CM

## 2017-02-12 MED ORDER — VITAMIN B-6 50 MG PO TABS: 50.0000 mg | ORAL_TABLET | Freq: Two times a day (BID) | ORAL | 1 refills | Status: DC

## 2017-02-12 MED ORDER — ASPIRIN EC 81 MG PO TBEC: 81.0000 mg | DELAYED_RELEASE_TABLET | Freq: Every day | ORAL | 2 refills | Status: DC

## 2017-02-12 NOTE — Addendum Note (Signed)
Addended by: Adam PhenixARNOLD, JAMES G on: 02/12/2017 02:12 PM   Modules accepted: Orders, SmartSet

## 2017-02-12 NOTE — Patient Instructions (Signed)
First Trimester of Pregnancy The first trimester of pregnancy is from week 1 until the end of week 13 (months 1 through 3). A week after a sperm fertilizes an egg, the egg will implant on the wall of the uterus. This embryo will begin to develop into a baby. Genes from you and your partner will form the baby. The female genes will determine whether the baby will be a boy or a girl. At 6-8 weeks, the eyes and face will be formed, and the heartbeat can be seen on ultrasound. At the end of 12 weeks, all the baby's organs will be formed. Now that you are pregnant, you will want to do everything you can to have a healthy baby. Two of the most important things are to get good prenatal care and to follow your health care provider's instructions. Prenatal care is all the medical care you receive before the baby's birth. This care will help prevent, find, and treat any problems during the pregnancy and childbirth. Body changes during your first trimester Your body goes through many changes during pregnancy. The changes vary from woman to woman.  You may gain or lose a couple of pounds at first.  You may feel sick to your stomach (nauseous) and you may throw up (vomit). If the vomiting is uncontrollable, call your health care provider.  You may tire easily.  You may develop headaches that can be relieved by medicines. All medicines should be approved by your health care provider.  You may urinate more often. Painful urination may mean you have a bladder infection.  You may develop heartburn as a result of your pregnancy.  You may develop constipation because certain hormones are causing the muscles that push stool through your intestines to slow down.  You may develop hemorrhoids or swollen veins (varicose veins).  Your breasts may begin to grow larger and become tender. Your nipples may stick out more, and the tissue that surrounds them (areola) may become darker.  Your gums may bleed and may be  sensitive to brushing and flossing.  Dark spots or blotches (chloasma, mask of pregnancy) may develop on your face. This will likely fade after the baby is born.  Your menstrual periods will stop.  You may have a loss of appetite.  You may develop cravings for certain kinds of food.  You may have changes in your emotions from day to day, such as being excited to be pregnant or being concerned that something may go wrong with the pregnancy and baby.  You may have more vivid and strange dreams.  You may have changes in your hair. These can include thickening of your hair, rapid growth, and changes in texture. Some women also have hair loss during or after pregnancy, or hair that feels dry or thin. Your hair will most likely return to normal after your baby is born.  What to expect at prenatal visits During a routine prenatal visit:  You will be weighed to make sure you and the baby are growing normally.  Your blood pressure will be taken.  Your abdomen will be measured to track your baby's growth.  The fetal heartbeat will be listened to between weeks 10 and 14 of your pregnancy.  Test results from any previous visits will be discussed.  Your health care provider may ask you:  How you are feeling.  If you are feeling the baby move.  If you have had any abnormal symptoms, such as leaking fluid, bleeding, severe headaches,   or abdominal cramping.  If you are using any tobacco products, including cigarettes, chewing tobacco, and electronic cigarettes.  If you have any questions.  Other tests that may be performed during your first trimester include:  Blood tests to find your blood type and to check for the presence of any previous infections. The tests will also be used to check for low iron levels (anemia) and protein on red blood cells (Rh antibodies). Depending on your risk factors, or if you previously had diabetes during pregnancy, you may have tests to check for high blood  sugar that affects pregnant women (gestational diabetes).  Urine tests to check for infections, diabetes, or protein in the urine.  An ultrasound to confirm the proper growth and development of the baby.  Fetal screens for spinal cord problems (spina bifida) and Down syndrome.  HIV (human immunodeficiency virus) testing. Routine prenatal testing includes screening for HIV, unless you choose not to have this test.  You may need other tests to make sure you and the baby are doing well.  Follow these instructions at home: Medicines  Follow your health care provider's instructions regarding medicine use. Specific medicines may be either safe or unsafe to take during pregnancy.  Take a prenatal vitamin that contains at least 600 micrograms (mcg) of folic acid.  If you develop constipation, try taking a stool softener if your health care provider approves. Eating and drinking  Eat a balanced diet that includes fresh fruits and vegetables, whole grains, good sources of protein such as meat, eggs, or tofu, and low-fat dairy. Your health care provider will help you determine the amount of weight gain that is right for you.  Avoid raw meat and uncooked cheese. These carry germs that can cause birth defects in the baby.  Eating four or five small meals rather than three large meals a day may help relieve nausea and vomiting. If you start to feel nauseous, eating a few soda crackers can be helpful. Drinking liquids between meals, instead of during meals, also seems to help ease nausea and vomiting.  Limit foods that are high in fat and processed sugars, such as fried and sweet foods.  To prevent constipation: ? Eat foods that are high in fiber, such as fresh fruits and vegetables, whole grains, and beans. ? Drink enough fluid to keep your urine clear or pale yellow. Activity  Exercise only as directed by your health care provider. Most women can continue their usual exercise routine during  pregnancy. Try to exercise for 30 minutes at least 5 days a week. Exercising will help you: ? Control your weight. ? Stay in shape. ? Be prepared for labor and delivery.  Experiencing pain or cramping in the lower abdomen or lower back is a good sign that you should stop exercising. Check with your health care provider before continuing with normal exercises.  Try to avoid standing for long periods of time. Move your legs often if you must stand in one place for a long time.  Avoid heavy lifting.  Wear low-heeled shoes and practice good posture.  You may continue to have sex unless your health care provider tells you not to. Relieving pain and discomfort  Wear a good support bra to relieve breast tenderness.  Take warm sitz baths to soothe any pain or discomfort caused by hemorrhoids. Use hemorrhoid cream if your health care provider approves.  Rest with your legs elevated if you have leg cramps or low back pain.  If you develop   varicose veins in your legs, wear support hose. Elevate your feet for 15 minutes, 3-4 times a day. Limit salt in your diet. Prenatal care  Schedule your prenatal visits by the twelfth week of pregnancy. They are usually scheduled monthly at first, then more often in the last 2 months before delivery.  Write down your questions. Take them to your prenatal visits.  Keep all your prenatal visits as told by your health care provider. This is important. Safety  Wear your seat belt at all times when driving.  Make a list of emergency phone numbers, including numbers for family, friends, the hospital, and police and fire departments. General instructions  Ask your health care provider for a referral to a local prenatal education class. Begin classes no later than the beginning of month 6 of your pregnancy.  Ask for help if you have counseling or nutritional needs during pregnancy. Your health care provider can offer advice or refer you to specialists for help  with various needs.  Do not use hot tubs, steam rooms, or saunas.  Do not douche or use tampons or scented sanitary pads.  Do not cross your legs for long periods of time.  Avoid cat litter boxes and soil used by cats. These carry germs that can cause birth defects in the baby and possibly loss of the fetus by miscarriage or stillbirth.  Avoid all smoking, herbs, alcohol, and medicines not prescribed by your health care provider. Chemicals in these products affect the formation and growth of the baby.  Do not use any products that contain nicotine or tobacco, such as cigarettes and e-cigarettes. If you need help quitting, ask your health care provider. You may receive counseling support and other resources to help you quit.  Schedule a dentist appointment. At home, brush your teeth with a soft toothbrush and be gentle when you floss. Contact a health care provider if:  You have dizziness.  You have mild pelvic cramps, pelvic pressure, or nagging pain in the abdominal area.  You have persistent nausea, vomiting, or diarrhea.  You have a bad smelling vaginal discharge.  You have pain when you urinate.  You notice increased swelling in your face, hands, legs, or ankles.  You are exposed to fifth disease or chickenpox.  You are exposed to German measles (rubella) and have never had it. Get help right away if:  You have a fever.  You are leaking fluid from your vagina.  You have spotting or bleeding from your vagina.  You have severe abdominal cramping or pain.  You have rapid weight gain or loss.  You vomit blood or material that looks like coffee grounds.  You develop a severe headache.  You have shortness of breath.  You have any kind of trauma, such as from a fall or a car accident. Summary  The first trimester of pregnancy is from week 1 until the end of week 13 (months 1 through 3).  Your body goes through many changes during pregnancy. The changes vary from  woman to woman.  You will have routine prenatal visits. During those visits, your health care provider will examine you, discuss any test results you may have, and talk with you about how you are feeling. This information is not intended to replace advice given to you by your health care provider. Make sure you discuss any questions you have with your health care provider. Document Released: 08/21/2001 Document Revised: 08/08/2016 Document Reviewed: 08/08/2016 Elsevier Interactive Patient Education  2017 [employer]   Inc.  

## 2017-02-12 NOTE — Progress Notes (Signed)
Subjective:    [name] is a [id number] [redacted]w[redacted]d being seen today for her first obstetrical visit.  Her obstetrical history is significant for 26 week fetal demise and CHTN. Patient does intend to breast feed. Pregnancy history fully reviewed.  Patient reports nausea.  Vitals:   02/12/17 1324  BP: (!) 94/55  Pulse: 84  Weight: 61.6 kg (135 lb 12.8 oz)    HISTORY: OB History  Gravida Para Term Preterm AB Living  6 3 2 1 2 2   SAB TAB Ectopic Multiple Live Births  2       2    # Outcome Date GA Lbr Len/2nd Weight Sex Delivery Anes PTL Lv  6 Current           5 Term 11/18/12 [redacted]w[redacted]d 06:07 / 01:22 3.759 kg (8 lb 4.6 oz) F Vag-Spont EPI  LIV  4 SAB 10/2007 [redacted]w[redacted]d         3 Term 02/14/05 [redacted]w[redacted]d  3.6 kg (7 lb 15 oz) M Vag-Spont EPI Y LIV  2 SAB 11/2003 [redacted]w[redacted]d         1 Preterm 08/2002 [redacted]w[redacted]d  0.907 kg (2 lb) M Vag-Spont None Y FD     Past Medical History:  Diagnosis Date  . Abnormal Pap smear   . Hypertension    Past Surgical History:  Procedure Laterality Date  . CERVIX LESION DESTRUCTION     Family History  Problem Relation Age of Onset  . Hypertension Mother   . Mental illness Mother        anxiety  . Hypertension Maternal Grandmother   . Cancer Paternal Grandmother   . Cancer Paternal Grandfather      Exam    Uterus:     Pelvic Exam:    Perineum: No Hemorrhoids   Vulva: normal   Vagina:  normal mucosa   pH:     Cervix: no lesions   Adnexa: normal adnexa   Bony Pelvis: average  System: Breast:  normal appearance, no masses or tenderness   Skin: normal coloration and turgor, no rashes    Neurologic: oriented, normal mood   Extremities: normal strength, tone, and muscle mass   HEENT sclera clear, anicteric and oropharynx clear, no lesions   Mouth/Teeth mucous membranes moist, pharynx normal without lesions and dental hygiene good   Neck supple   Cardiovascular: regular rate and rhythm, no murmurs or gallops   Respiratory:  appears well, vitals normal, no  respiratory distress, acyanotic, normal RR, neck free of mass or lymphadenopathy, chest clear, no wheezing, crepitations, rhonchi, normal symmetric air entry   Abdomen: soft, non-tender; bowel sounds normal; no masses,  no organomegaly   Urinary: urethral meatus normal      Assessment:    Pregnancy: [id number] Patient Active Problem List   Diagnosis Date Noted  . Supervision of other normal pregnancy, antepartum 02/12/2017  . Migraine headache without aura 09/28/2016  . Depression with anxiety 09/24/2013  . HTN (hypertension) 09/24/2013  . Premature cervical dilation 09/30/2012  . Cigarette smoker 05/29/2012  . History of chlamydia 05/29/2012  . History of cryosurgery of cervix complicating pregnancy 05/29/2012        Plan:     Initial labs drawn. Prenatal vitamins. Problem list reviewed and updated. Genetic Screening discussed First Screen:  Ultrasound discussed; fetal survey: 18+ weeks.  Follow up in 4 weeks. 50% of 30 min visit spent on counseling and coordination of care.  [ ]  Aspirin 81 mg daily after 12 weeks; discontinue  after 36 weeks Current antihypertensives:  HCTZ, d/c today   Baseline and surveillance labs [facility] refresh links as needed)  Lab Results  Component Value Date   PLT 240.0 01/13/2016   CREATININE 0.71 01/13/2016   AST 14 01/13/2016   ALT 13 01/13/2016    Antenatal Testing CHTN - O10.919  Group I  BP < 140/90, no preeclampsia, AGA,  nml AFV, +/- meds    Group II BP > 140/90, on meds, no preeclampsia, AGA, nml AFV  20-28-34-38  20-24-28-32-35-38  32//2 x wk  28//BPP wkly then 32//2 x wk  40 no meds; 39 meds  PRN or 37  Pre-eclampsia  GHTN - O13.9/Preeclampsia without severe features  - O14.00   Preeclampsia with severe features - O14.10  Q 3-4wks  Q 2 wks  28//BPP wkly then 32//2 x wk  Inpatient  37  PRN or 34      [name] 02/12/2017

## 2017-02-12 NOTE — Progress Notes (Signed)
Last pap last year at [facility]. First trimester screening scheduled March 06 829

## 2017-02-13 LAB — OBSTETRIC PANEL, INCLUDING HIV
ANTIBODY SCREEN: NEGATIVE
BASOS: 0 %
Basophils Absolute: 0 10*3/uL (ref 0.0–0.2)
EOS (ABSOLUTE): 0 10*3/uL (ref 0.0–0.4)
EOS: 1 %
HEMATOCRIT: 36.6 % (ref 34.0–46.6)
HEMOGLOBIN: 12.6 g/dL (ref 11.1–15.9)
HIV SCREEN 4TH GENERATION: NONREACTIVE
Hepatitis B Surface Ag: NEGATIVE
Immature Grans (Abs): 0 10*3/uL (ref 0.0–0.1)
Immature Granulocytes: 0 %
Lymphocytes Absolute: 1.2 10*3/uL (ref 0.7–3.1)
Lymphs: 36 %
MCH: 30.1 pg (ref 26.6–33.0)
MCHC: 34.4 g/dL (ref 31.5–35.7)
MCV: 87 fL (ref 79–97)
MONOS ABS: 0.3 10*3/uL (ref 0.1–0.9)
Monocytes: 8 %
NEUTROS ABS: 1.8 10*3/uL (ref 1.4–7.0)
Neutrophils: 55 %
Platelets: 215 10*3/uL (ref 150–379)
RBC: 4.19 x10E6/uL (ref 3.77–5.28)
RDW: 14 % (ref 12.3–15.4)
RH TYPE: POSITIVE
RPR Ser Ql: NONREACTIVE
Rubella Antibodies, IGG: 7.08 index (ref >0.99)
WBC: 3.4 10*3/uL (ref 3.4–10.8)

## 2017-02-13 LAB — PROTEIN / CREATININE RATIO, URINE
Creatinine, Urine: 188.3 mg/dL
PROTEIN UR: 15 mg/dL
Protein/Creat Ratio: 80 mg/g creat (ref 0–200)

## 2017-02-13 LAB — HEMOGLOBINOPATHY EVALUATION
Ferritin: 43 ng/mL (ref 15–150)
HGB A2 QUANT: 2.5 % (ref 1.8–3.2)
HGB A: 97.5 % (ref 96.4–98.8)
HGB C: 0 %
HGB S: 0 %
HGB SOLUBILITY: NEGATIVE
HGB VARIANT: 0 %
Hgb F Quant: 0 % (ref 0.0–2.0)

## 2017-02-13 LAB — COMPREHENSIVE METABOLIC PANEL
A/G RATIO: 1.8 (ref 1.2–2.2)
ALBUMIN: 4.6 g/dL (ref 3.5–5.5)
ALT: 16 IU/L (ref 0–32)
AST: 14 IU/L (ref 0–40)
Alkaline Phosphatase: 49 IU/L (ref 39–117)
BUN / CREAT RATIO: 12 (ref 9–23)
BUN: 7 mg/dL (ref 6–20)
Bilirubin Total: <0.2 mg/dL (ref 0.0–1.2)
CALCIUM: 9.2 mg/dL (ref 8.7–10.2)
CO2: 26 mmol/L (ref 18–29)
CREATININE: 0.6 mg/dL (ref 0.57–1.00)
Chloride: 98 mmol/L (ref 96–106)
GFR, EST AFRICAN AMERICAN: 139 mL/min/{1.73_m2} (ref >59)
GFR, EST NON AFRICAN AMERICAN: 120 mL/min/{1.73_m2} (ref >59)
GLOBULIN, TOTAL: 2.5 g/dL (ref 1.5–4.5)
Glucose: 69 mg/dL (ref 65–99)
POTASSIUM: 4 mmol/L (ref 3.5–5.2)
SODIUM: 136 mmol/L (ref 134–144)
TOTAL PROTEIN: 7.1 g/dL (ref 6.0–8.5)

## 2017-02-17 LAB — URINE CULTURE, OB REFLEX

## 2017-02-17 LAB — CULTURE, OB URINE

## 2017-03-06 ENCOUNTER — Other Ambulatory Visit: Payer: Self-pay | Admitting: Obstetrics & Gynecology

## 2017-03-06 ENCOUNTER — Ambulatory Visit (HOSPITAL_COMMUNITY)
Admission: RE | Admit: 2017-03-06 | Discharge: 2017-03-06 | Disposition: A | Discharge status: Home / Self Care | Payer: Managed Care, Other (non HMO) | Source: Ambulatory Visit | Attending: Obstetrics & Gynecology | Admitting: Obstetrics & Gynecology

## 2017-03-06 ENCOUNTER — Encounter (HOSPITAL_COMMUNITY): Payer: Self-pay

## 2017-03-06 DIAGNOSIS — O10011 Pre-existing essential hypertension complicating pregnancy, first trimester: Secondary | ICD-10-CM | POA: Diagnosis not present

## 2017-03-06 DIAGNOSIS — O09291 Supervision of pregnancy with other poor reproductive or obstetric history, first trimester: Secondary | ICD-10-CM | POA: Diagnosis present

## 2017-03-06 DIAGNOSIS — O99331 Smoking (tobacco) complicating pregnancy, first trimester: Secondary | ICD-10-CM | POA: Diagnosis present

## 2017-03-06 DIAGNOSIS — O09899 Supervision of other high risk pregnancies, unspecified trimester: Secondary | ICD-10-CM

## 2017-03-06 DIAGNOSIS — Z3A12 12 weeks gestation of pregnancy: Secondary | ICD-10-CM

## 2017-03-06 DIAGNOSIS — Z3682 Encounter for antenatal screening for nuchal translucency: Secondary | ICD-10-CM

## 2017-03-06 DIAGNOSIS — Z3480 Encounter for supervision of other normal pregnancy, unspecified trimester: Secondary | ICD-10-CM

## 2017-03-06 DIAGNOSIS — O09219 Supervision of pregnancy with history of pre-term labor, unspecified trimester: Secondary | ICD-10-CM

## 2017-03-08 ENCOUNTER — Other Ambulatory Visit: Payer: Self-pay

## 2017-03-12 ENCOUNTER — Encounter: Payer: Self-pay | Admitting: Advanced Practice Midwife

## 2017-03-12 ENCOUNTER — Ambulatory Visit (INDEPENDENT_AMBULATORY_CARE_PROVIDER_SITE_OTHER): Payer: Managed Care, Other (non HMO) | Admitting: Advanced Practice Midwife

## 2017-03-12 VITALS — BP 92/60 | HR 76 | Wt 140.1 lb

## 2017-03-12 DIAGNOSIS — O09899 Supervision of other high risk pregnancies, unspecified trimester: Secondary | ICD-10-CM

## 2017-03-12 DIAGNOSIS — O10912 Unspecified pre-existing hypertension complicating pregnancy, second trimester: Secondary | ICD-10-CM

## 2017-03-12 DIAGNOSIS — Z9889 Other specified postprocedural states: Principal | ICD-10-CM

## 2017-03-12 DIAGNOSIS — O09892 Supervision of other high risk pregnancies, second trimester: Secondary | ICD-10-CM

## 2017-03-12 DIAGNOSIS — O10919 Unspecified pre-existing hypertension complicating pregnancy, unspecified trimester: Secondary | ICD-10-CM

## 2017-03-12 DIAGNOSIS — O3442 Maternal care for other abnormalities of cervix, second trimester: Secondary | ICD-10-CM

## 2017-03-12 NOTE — Progress Notes (Signed)
   PRENATAL VISIT NOTE  Subjective:  [name] is a 36 y.o. [id number] at 6361w5d being seen today for ongoing prenatal care.  She is currently monitored for the following issues for this high-risk pregnancy and has Cigarette smoker; History of chlamydia; History of cryosurgery of cervix complicating pregnancy; Premature cervical dilation; Depression with anxiety; Chronic hypertension during pregnancy, antepartum; Migraine headache without aura; and Supervision of other normal pregnancy, antepartum on her problem list.    Patient reports no complaints.  Contractions: Not present.   Movement: Present. Denies leaking of fluid.  At this time she denies headaches, visual changes, vaginal bleeding or vaginal discharge.   The following portions of the patient's history were reviewed and updated as appropriate: allergies, current medications, past family history, past medical history, past social history, past surgical history and problem list. Problem list updated.  Objective:   Vitals:   03/12/17 0812  BP: 92/60  Pulse: 76  Weight: 140 lb 1.6 oz (63.5 kg)    Fetal Status: Fetal Heart Rate (bpm): 160   Movement: Present     General:  Alert, oriented and cooperative. Patient is in no acute distress.  Skin: Skin is warm and dry. No rash noted.   Cardiovascular: Normal heart rate noted  Respiratory: Normal respiratory effort, no problems with respiration noted  Abdomen: Soft, gravid, appropriate for gestational age. Pain/Pressure: Absent     Pelvic:  Cervical exam deferred        Extremities: Normal range of motion.     Mental Status: Normal mood and affect. Normal behavior. Normal judgment and thought content.   Assessment and Plan:  Pregnancy: [id number] at 5561w5d  Chronic Hypertension during Pregnancy, antepartum  Patient inquired about reason for taking baby aspirin even though her BP is low right now.  It was explained that the purpose of aspirin is not to decrease her blood pressure,  but to decrease her risk of developing preeclampsia later in pregnancy.    Her blood pressures at the moment do not require anti-hypertensive medical therapy.  It was explained to her that she may need to be restarted on BP medication as her pregnancy progresses if her pressures began to rise.   There are no diagnoses linked to this encounter. Preterm labor symptoms and general obstetric precautions including but not limited to vaginal bleeding, contractions, leaking of fluid and fetal movement were reviewed in detail with the patient. Please refer to After Visit Summary for other counseling recommendations.  No Follow-up on file.   Chrissie NoaMatthew Coulton Schlink, Student-PA

## 2017-04-04 ENCOUNTER — Encounter: Payer: Self-pay | Admitting: Obstetrics & Gynecology

## 2017-04-09 ENCOUNTER — Ambulatory Visit (INDEPENDENT_AMBULATORY_CARE_PROVIDER_SITE_OTHER): Payer: Managed Care, Other (non HMO) | Admitting: Certified Nurse Midwife

## 2017-04-09 VITALS — BP 91/57 | HR 73 | Wt 145.2 lb

## 2017-04-09 DIAGNOSIS — O3430 Maternal care for cervical incompetence, unspecified trimester: Secondary | ICD-10-CM

## 2017-04-09 DIAGNOSIS — O10919 Unspecified pre-existing hypertension complicating pregnancy, unspecified trimester: Secondary | ICD-10-CM

## 2017-04-09 DIAGNOSIS — O09892 Supervision of other high risk pregnancies, second trimester: Secondary | ICD-10-CM

## 2017-04-09 DIAGNOSIS — O10912 Unspecified pre-existing hypertension complicating pregnancy, second trimester: Secondary | ICD-10-CM

## 2017-04-09 DIAGNOSIS — O09899 Supervision of other high risk pregnancies, unspecified trimester: Secondary | ICD-10-CM

## 2017-04-09 DIAGNOSIS — O3432 Maternal care for cervical incompetence, second trimester: Secondary | ICD-10-CM

## 2017-04-09 NOTE — Progress Notes (Signed)
Subjective:  [name] is a 36 y.o. [id number] at 6712w5d being seen today for ongoing prenatal care.  She is currently monitored for the following issues for this high-risk pregnancy and has Cigarette smoker; History of chlamydia; History of cryosurgery of cervix complicating pregnancy; Premature cervical dilation; Depression with anxiety; Chronic hypertension during pregnancy, antepartum; Migraine headache without aura; and Supervision of other high risk pregnancy, antepartum on her problem list.  Patient reports no complaints.  Contractions: Not present. Vag. Bleeding: None.  Movement: Present. Denies leaking of fluid.   The following portions of the patient's history were reviewed and updated as appropriate: allergies, current medications, past family history, past medical history, past social history, past surgical history and problem list. Problem list updated.  Objective:   Vitals:   04/09/17 0751  BP: (!) 91/57  Pulse: 73  Weight: 145 lb 3.2 oz (65.9 kg)    Fetal Status: Fetal Heart Rate (bpm): 156 Fundal Height: 17 cm Movement: Present     General:  Alert, oriented and cooperative. Patient is in no acute distress.  Skin: Skin is warm and dry. No rash noted.   Cardiovascular: Normal heart rate noted  Respiratory: Normal respiratory effort, no problems with respiration noted  Abdomen: Soft, gravid, appropriate for gestational age. Pain/Pressure: Absent     Pelvic: Vag. Bleeding: None     Cervical exam deferred        Extremities: Normal range of motion.  Edema: None  Mental Status: Normal mood and affect. Normal behavior. Normal judgment and thought content.   Urinalysis:      Assessment and Plan:  Pregnancy: [id number] at 9812w5d  1. Supervision of other high risk pregnancy, antepartum - US MFM OB DETAIL +14 WK; Future  2. Chronic hypertension during pregnancy, antepartum - BP stable  - Continue baby ASA  Preterm labor symptoms and general obstetric precautions including  but not limited to vaginal bleeding, contractions, leaking of fluid and fetal movement were reviewed in detail with the patient. Please refer to After Visit Summary for other counseling recommendations.  Return in about 4 weeks (around 05/07/2017).   Donette LarryBhambri, Fariha Goto, CNM

## 2017-04-19 ENCOUNTER — Other Ambulatory Visit: Payer: Self-pay | Admitting: Certified Nurse Midwife

## 2017-04-19 ENCOUNTER — Ambulatory Visit (HOSPITAL_COMMUNITY)
Admission: RE | Admit: 2017-04-19 | Discharge: 2017-04-19 | Disposition: A | Discharge status: Home / Self Care | Payer: Managed Care, Other (non HMO) | Source: Ambulatory Visit | Attending: Certified Nurse Midwife | Admitting: Certified Nurse Midwife

## 2017-04-19 DIAGNOSIS — O10012 Pre-existing essential hypertension complicating pregnancy, second trimester: Secondary | ICD-10-CM

## 2017-04-19 DIAGNOSIS — Z3689 Encounter for other specified antenatal screening: Secondary | ICD-10-CM | POA: Diagnosis not present

## 2017-04-19 DIAGNOSIS — O09299 Supervision of pregnancy with other poor reproductive or obstetric history, unspecified trimester: Secondary | ICD-10-CM

## 2017-04-19 DIAGNOSIS — O99332 Smoking (tobacco) complicating pregnancy, second trimester: Secondary | ICD-10-CM | POA: Diagnosis present

## 2017-04-19 DIAGNOSIS — Z3A19 19 weeks gestation of pregnancy: Secondary | ICD-10-CM | POA: Diagnosis not present

## 2017-04-19 DIAGNOSIS — O3430 Maternal care for cervical incompetence, unspecified trimester: Secondary | ICD-10-CM

## 2017-04-19 DIAGNOSIS — O09899 Supervision of other high risk pregnancies, unspecified trimester: Secondary | ICD-10-CM

## 2017-05-07 ENCOUNTER — Ambulatory Visit (INDEPENDENT_AMBULATORY_CARE_PROVIDER_SITE_OTHER): Payer: Managed Care, Other (non HMO) | Admitting: Medical

## 2017-05-07 VITALS — BP 99/61 | HR 70 | Wt 155.0 lb

## 2017-05-07 DIAGNOSIS — O09899 Supervision of other high risk pregnancies, unspecified trimester: Secondary | ICD-10-CM

## 2017-05-07 DIAGNOSIS — O10919 Unspecified pre-existing hypertension complicating pregnancy, unspecified trimester: Secondary | ICD-10-CM

## 2017-05-07 DIAGNOSIS — O3442 Maternal care for other abnormalities of cervix, second trimester: Secondary | ICD-10-CM

## 2017-05-07 DIAGNOSIS — Z9889 Other specified postprocedural states: Secondary | ICD-10-CM

## 2017-05-07 NOTE — Progress Notes (Signed)
99/61 

## 2017-05-07 NOTE — Patient Instructions (Signed)

## 2017-05-07 NOTE — Progress Notes (Signed)
   PRENATAL VISIT NOTE  Subjective:  [name] is a 36 y.o. [id number] at [redacted]w[redacted]d being seen today for ongoing prenatal care.  She is currently monitored for the following issues for this high-risk pregnancy and has Cigarette smoker; History of chlamydia; History of cryosurgery of cervix complicating pregnancy; Premature cervical dilation; Depression with anxiety; Chronic hypertension during pregnancy, antepartum; Migraine headache without aura; and Supervision of other high risk pregnancy, antepartum on her problem list.  Patient reports no complaints.  Contractions: Not present. Vag. Bleeding: None.  Movement: Present. Denies leaking of fluid.   The following portions of the patient's history were reviewed and updated as appropriate: allergies, current medications, past family history, past medical history, past social history, past surgical history and problem list. Problem list updated.  Objective:   Vitals:   05/07/17 0751  BP: 99/61  Pulse: 70  Weight: 155 lb (70.3 kg)    Fetal Status: Fetal Heart Rate (bpm): 152   Movement: Present   Fundal height: 21 cm  General:  Alert, oriented and cooperative. Patient is in no acute distress.  Skin: Skin is warm and dry. No rash noted.   Cardiovascular: Normal heart rate noted  Respiratory: Normal respiratory effort, no problems with respiration noted  Abdomen: Soft, gravid, appropriate for gestational age.  Pain/Pressure: Present     Pelvic: Cervical exam deferred        Extremities: Normal range of motion.  Edema: None  Mental Status:  Normal mood and affect. Normal behavior. Normal judgment and thought content.   Assessment and Plan:  Pregnancy: [id number] at [redacted]w[redacted]d  1. Supervision of other high risk pregnancy, antepartum - Doing well, no complaints  2. Chronic hypertension during pregnancy, antepartum - Taking ASA - Normotensive today without medications - Advised of need for growth [place] at 28 weeks and antenatal testing starting at  32 weeks  3. History of cryosurgery of cervix affecting pregnancy in second trimester - No contractions noted by patient - Cervical length > 3 cm without funneling on [place] 04/19/17  4. History of IUFD - Deliver at 39 weeks  Preterm labor symptoms and general obstetric precautions including but not limited to vaginal bleeding, contractions, leaking of fluid and fetal movement were reviewed in detail with the patient. Please refer to After Visit Summary for other counseling recommendations.  Return in about 4 weeks (around 06/04/2017) for [facility].   Vonzella Nipple, PA-C

## 2017-06-04 ENCOUNTER — Ambulatory Visit (INDEPENDENT_AMBULATORY_CARE_PROVIDER_SITE_OTHER): Payer: Managed Care, Other (non HMO) | Admitting: Family Medicine

## 2017-06-04 VITALS — BP 98/58 | HR 71 | Wt 164.4 lb

## 2017-06-04 DIAGNOSIS — O09899 Supervision of other high risk pregnancies, unspecified trimester: Secondary | ICD-10-CM

## 2017-06-04 DIAGNOSIS — O10919 Unspecified pre-existing hypertension complicating pregnancy, unspecified trimester: Secondary | ICD-10-CM

## 2017-06-04 NOTE — Progress Notes (Signed)
   PRENATAL VISIT NOTE  Subjective:  [name] is a 36 y.o. Z6X0960 at 107w5d being seen today for ongoing prenatal care.  She is currently monitored for the following issues for this high-risk pregnancy and has Cigarette smoker; History of chlamydia; History of cryosurgery of cervix complicating pregnancy; Depression with anxiety; Chronic hypertension during pregnancy, antepartum; Migraine headache without aura; and Supervision of other high risk pregnancy, antepartum on her problem list.  Patient reports no complaints.  Contractions: Not present.  .  Movement: Present. Denies leaking of fluid.   The following portions of the patient's history were reviewed and updated as appropriate: allergies, current medications, past family history, past medical history, past social history, past surgical history and problem list. Problem list updated.  Objective:   Vitals:   06/04/17 0823  BP: (!) 98/58  Pulse: 71  Weight: 164 lb 6.4 oz (74.6 kg)    Fetal Status: Fetal Heart Rate (bpm): 144 Fundal Height: 25 cm Movement: Present     General:  Alert, oriented and cooperative. Patient is in no acute distress.  Skin: Skin is warm and dry. No rash noted.   Cardiovascular: Normal heart rate noted  Respiratory: Normal respiratory effort, no problems with respiration noted  Abdomen: Soft, gravid, appropriate for gestational age.  Pain/Pressure: Present     Pelvic: Cervical exam deferred        Extremities: Normal range of motion.  Edema: None  Mental Status:  Normal mood and affect. Normal behavior. Normal judgment and thought content.   Assessment and Plan:  Pregnancy: A5W0981 at [redacted]w[redacted]d  1. Supervision of other high risk pregnancy, antepartum - Routine PNC - 28-week labs with 2-hr GTT next visit  2. Chronic hypertension during pregnancy, antepartum Normal BP today - Continue ASA until 36 weeks - Growth scan at 28 weeks scheduled - Start antenatal testing at 32 weeks   Preterm labor  symptoms and general obstetric precautions including but not limited to vaginal bleeding, contractions, leaking of fluid and fetal movement were reviewed in detail with the patient. Please refer to After Visit Summary for other counseling recommendations.  Return in about 3 weeks (around 06/25/2017) for [facility], 28-wk labs w/ 2-hr GTT.   Frederik Pear, MD   Future Appointments Date Time Provider Department Center  06/18/2017 12:30 PM [facility] [place] 1 [facility]  06/25/2017 8:20 AM Dorathy Kinsman, CNM [facility]

## 2017-06-18 ENCOUNTER — Ambulatory Visit (HOSPITAL_COMMUNITY)
Admission: RE | Admit: 2017-06-18 | Discharge: 2017-06-18 | Disposition: A | Discharge status: Home / Self Care | Payer: Managed Care, Other (non HMO) | Source: Ambulatory Visit | Attending: Family Medicine | Admitting: Family Medicine

## 2017-06-18 ENCOUNTER — Other Ambulatory Visit (HOSPITAL_COMMUNITY): Payer: Self-pay | Admitting: *Deleted

## 2017-06-18 ENCOUNTER — Other Ambulatory Visit: Payer: Self-pay | Admitting: Family Medicine

## 2017-06-18 ENCOUNTER — Encounter (HOSPITAL_COMMUNITY): Payer: Self-pay

## 2017-06-18 DIAGNOSIS — O10919 Unspecified pre-existing hypertension complicating pregnancy, unspecified trimester: Secondary | ICD-10-CM

## 2017-06-18 DIAGNOSIS — O09292 Supervision of pregnancy with other poor reproductive or obstetric history, second trimester: Secondary | ICD-10-CM | POA: Diagnosis not present

## 2017-06-18 DIAGNOSIS — O99332 Smoking (tobacco) complicating pregnancy, second trimester: Secondary | ICD-10-CM | POA: Insufficient documentation

## 2017-06-18 DIAGNOSIS — O10012 Pre-existing essential hypertension complicating pregnancy, second trimester: Secondary | ICD-10-CM | POA: Insufficient documentation

## 2017-06-18 DIAGNOSIS — Z3A27 27 weeks gestation of pregnancy: Secondary | ICD-10-CM | POA: Insufficient documentation

## 2017-06-18 DIAGNOSIS — O09299 Supervision of pregnancy with other poor reproductive or obstetric history, unspecified trimester: Secondary | ICD-10-CM

## 2017-06-25 ENCOUNTER — Ambulatory Visit (INDEPENDENT_AMBULATORY_CARE_PROVIDER_SITE_OTHER): Payer: Managed Care, Other (non HMO) | Admitting: Advanced Practice Midwife

## 2017-06-25 VITALS — BP 95/53 | HR 73 | Wt 170.2 lb

## 2017-06-25 DIAGNOSIS — O0990 Supervision of high risk pregnancy, unspecified, unspecified trimester: Secondary | ICD-10-CM

## 2017-06-25 DIAGNOSIS — O0993 Supervision of high risk pregnancy, unspecified, third trimester: Secondary | ICD-10-CM

## 2017-06-25 DIAGNOSIS — Z23 Encounter for immunization: Secondary | ICD-10-CM | POA: Diagnosis not present

## 2017-06-25 DIAGNOSIS — O09899 Supervision of other high risk pregnancies, unspecified trimester: Secondary | ICD-10-CM

## 2017-06-25 DIAGNOSIS — O10919 Unspecified pre-existing hypertension complicating pregnancy, unspecified trimester: Secondary | ICD-10-CM

## 2017-06-25 DIAGNOSIS — O10913 Unspecified pre-existing hypertension complicating pregnancy, third trimester: Secondary | ICD-10-CM

## 2017-06-25 NOTE — Progress Notes (Signed)
   PRENATAL VISIT NOTE  Subjective:  [name] is a 36 y.o. Z6X0960 at [redacted]w[redacted]d being seen today for ongoing prenatal care.  She is currently monitored for the following issues for this high-risk pregnancy and has Cigarette smoker; History of chlamydia; History of cryosurgery of cervix complicating pregnancy; Depression with anxiety; Chronic hypertension during pregnancy, antepartum; Migraine headache without aura; and Supervision of other high risk pregnancy, antepartum on her problem list.  Patient reports no complaints.  Contractions: Irregular. Vag. Bleeding: None.  Movement: Present. Denies leaking of fluid.   The following portions of the patient's history were reviewed and updated as appropriate: allergies, current medications, past family history, past medical history, past social history, past surgical history and problem list. Problem list updated.  Objective:   Vitals:   06/25/17 0845  BP: (!) 95/53  Pulse: 73  Weight: 170 lb 3.2 oz (77.2 kg)    Fetal Status: Fetal Heart Rate (bpm): 146 Fundal Height: 30 cm Movement: Present     General:  Alert, oriented and cooperative. Patient is in no acute distress.  Skin: Skin is warm and dry. No rash noted.   Cardiovascular: Normal heart rate noted  Respiratory: Normal respiratory effort, no problems with respiration noted  Abdomen: Soft, gravid, appropriate for gestational age.  Pain/Pressure: Present     Pelvic: Cervical exam deferred        Extremities: Normal range of motion.  Edema: None  Mental Status:  Normal mood and affect. Normal behavior. Normal judgment and thought content.   Assessment and Plan:  Pregnancy: A5W0981 at [redacted]w[redacted]d  1. Supervision of high risk pregnancy, antepartum Patient is doing well without complaints Third trimester labs and glucola today - Glucose Tolerance, 2 Hours w/1 Hour - RPR - CBC - HIV antibody - Tdap vaccine greater than or equal to 7yo IM - Flu Vaccine QUAD 36+ mos IM   2. Chronic  hypertension during pregnancy, antepartum Patient is normotensive without meds Continue daily ASA Follow up growth ultrasound in November 10/9 EFW 52 %tile  Preterm labor symptoms and general obstetric precautions including but not limited to vaginal bleeding, contractions, leaking of fluid and fetal movement were reviewed in detail with the patient. Please refer to After Visit Summary for other counseling recommendations.  No Follow-up on file.   Catalina Antigua, MD

## 2017-06-26 LAB — GLUCOSE TOLERANCE, 2 HOURS W/ 1HR
GLUCOSE, FASTING: 87 mg/dL (ref 65–91)
Glucose, 1 hour: 134 mg/dL (ref 65–179)
Glucose, 2 hour: 97 mg/dL (ref 65–152)

## 2017-06-26 LAB — CBC
HEMATOCRIT: 29.3 % — AB (ref 34.0–46.6)
Hemoglobin: 9.8 g/dL — ABNORMAL LOW (ref 11.1–15.9)
MCH: 30.5 pg (ref 26.6–33.0)
MCHC: 33.4 g/dL (ref 31.5–35.7)
MCV: 91 fL (ref 79–97)
Platelets: 162 10*3/uL (ref 150–379)
RBC: 3.21 x10E6/uL — ABNORMAL LOW (ref 3.77–5.28)
RDW: 13.5 % (ref 12.3–15.4)
WBC: 6 10*3/uL (ref 3.4–10.8)

## 2017-06-26 LAB — HIV ANTIBODY (ROUTINE TESTING W REFLEX): HIV Screen 4th Generation wRfx: NONREACTIVE

## 2017-06-26 LAB — RPR: RPR Ser Ql: NONREACTIVE

## 2017-06-28 ENCOUNTER — Encounter: Payer: Self-pay | Admitting: Obstetrics & Gynecology

## 2017-07-01 ENCOUNTER — Telehealth: Payer: Self-pay

## 2017-07-01 NOTE — Telephone Encounter (Signed)
LM for pt to contact the office in regards to her FMLA paperwork.  Re: Need to know if we are filling out paperwork for intermittent or delivery.

## 2017-07-01 NOTE — Telephone Encounter (Signed)
Notified pt of low Hgb and the recommendation of starting Iron 325mg  tablet daily.  I also informed pt that iron can cause constipation so she can take Colace 100 mg po bid to help with prevention of constipation.  Pt stated understanding with no further questions.

## 2017-07-01 NOTE — Telephone Encounter (Signed)
-----   Message from [name], [place] sent at 06/30/2017  7:58 AM EDT ----- Please inform pt of Nml labs except Hgb low. Start FeSO4 325 QD.

## 2017-07-01 NOTE — Telephone Encounter (Signed)
Pt returned call and stated that she needed her FMLA paperwork filled out for intermittent leave.  I informed pt that they will be completed and faxed today.  I also informed pt that she will be able to pick up original copy at her next appointment.   Pt stated thank you with no further questions.

## 2017-07-03 ENCOUNTER — Encounter (HOSPITAL_COMMUNITY): Payer: Self-pay | Admitting: *Deleted

## 2017-07-03 ENCOUNTER — Inpatient Hospital Stay (HOSPITAL_COMMUNITY)
Admission: AD | Admit: 2017-07-03 | Discharge: 2017-07-03 | Disposition: A | Discharge status: Home / Self Care | Payer: Managed Care, Other (non HMO) | Source: Ambulatory Visit | Attending: Family Medicine | Admitting: Family Medicine

## 2017-07-03 DIAGNOSIS — O09899 Supervision of other high risk pregnancies, unspecified trimester: Secondary | ICD-10-CM

## 2017-07-03 DIAGNOSIS — O26893 Other specified pregnancy related conditions, third trimester: Secondary | ICD-10-CM

## 2017-07-03 DIAGNOSIS — Z3689 Encounter for other specified antenatal screening: Secondary | ICD-10-CM

## 2017-07-03 DIAGNOSIS — Z3A29 29 weeks gestation of pregnancy: Secondary | ICD-10-CM | POA: Insufficient documentation

## 2017-07-03 DIAGNOSIS — N898 Other specified noninflammatory disorders of vagina: Secondary | ICD-10-CM | POA: Diagnosis not present

## 2017-07-03 DIAGNOSIS — O4703 False labor before 37 completed weeks of gestation, third trimester: Secondary | ICD-10-CM | POA: Diagnosis not present

## 2017-07-03 DIAGNOSIS — Z87891 Personal history of nicotine dependence: Secondary | ICD-10-CM | POA: Diagnosis not present

## 2017-07-03 DIAGNOSIS — Z7982 Long term (current) use of aspirin: Secondary | ICD-10-CM | POA: Insufficient documentation

## 2017-07-03 LAB — AMNISURE RUPTURE OF MEMBRANE (ROM) NOT AT ARMC: Amnisure ROM: NEGATIVE

## 2017-07-03 LAB — URINALYSIS, ROUTINE W REFLEX MICROSCOPIC
Bilirubin Urine: NEGATIVE
Glucose, UA: NEGATIVE mg/dL
Hgb urine dipstick: NEGATIVE
Ketones, ur: NEGATIVE mg/dL
Leukocytes, UA: NEGATIVE
Nitrite: NEGATIVE
Protein, ur: NEGATIVE mg/dL
Specific Gravity, Urine: 1.012 (ref 1.005–1.030)
pH: 7 (ref 5.0–8.0)

## 2017-07-03 LAB — WET PREP, GENITAL
Clue Cells Wet Prep HPF POC: NONE SEEN
Sperm: NONE SEEN
Trich, Wet Prep: NONE SEEN
Yeast Wet Prep HPF POC: NONE SEEN

## 2017-07-03 MED ORDER — NIFEDIPINE 10 MG PO CAPS
10.0000 mg | ORAL_CAPSULE | ORAL | Status: AC
  Administered 2017-07-03 (×2): 10 mg via ORAL
  Filled 2017-07-03 (×3): qty 1

## 2017-07-03 NOTE — Discharge Instructions (Signed)

## 2017-07-03 NOTE — MAU Provider Note (Signed)
History     CSN: [id number]  Arrival date and time: 07/03/17 [id number]   None     Chief Complaint  Patient presents with  . Contractions   [id number] @29 .6 wks here with c/o ctx. She reports ctx started 2 weeks ago and worsened last night. She thinks she had 5 ctx in 1 hr. She also reports seeing a clear watery discharge around 3 am. No leaking since. Denies VB. Reports good FM. No recent IC.     OB History    Gravida Para Term Preterm AB Living   6 3 2 1 2 2    SAB TAB Ectopic Multiple Live Births   2       2      Past Medical History:  Diagnosis Date  . Abnormal Pap smear   . Hypertension     Past Surgical History:  Procedure Laterality Date  . CERVIX LESION DESTRUCTION      Family History  Problem Relation Age of Onset  . Hypertension Mother   . Mental illness Mother        anxiety  . Hypertension Maternal Grandmother   . Cancer Paternal Grandmother   . Cancer Paternal Grandfather     Social History  Substance Use Topics  . Smoking status: Former Smoker    Packs/day: 0.50    Years: 12.00    Types: Cigarettes    Quit date: 06/06/2012  . Smokeless tobacco: Never Used  . Alcohol use No     Comment: socially    Allergies: No Known Allergies  Prescriptions Prior to Admission  Medication Sig Dispense Refill Last Dose  . aspirin EC 81 MG tablet Take 1 tablet (81 mg total) by mouth daily. 150 tablet 2 07/02/2017 at Unknown time  . doxylamine, Sleep, (UNISOM) 25 MG tablet Take 25 mg by mouth at bedtime as needed (for nausea).    Past Week at Unknown time  . Prenatal Vit-Fe Fumarate-FA (PRENATAL MULTIVITAMIN) TABS tablet Take 1 tablet by mouth at bedtime.   07/02/2017 at Unknown time  . pyridOXINE (VITAMIN B-6) 50 MG tablet Take 1 tablet (50 mg total) by mouth 2 (two) times daily. (Patient taking differently: Take 50 mg by mouth 2 (two) times daily as needed (for nausea). ) 60 tablet 1 Past Week at Unknown time    Review of Systems  Gastrointestinal: Positive for  abdominal pain.  Genitourinary: Positive for vaginal discharge. Negative for vaginal bleeding.   Physical Exam   Blood pressure 106/65, pulse 75, temperature 98.7 F (37.1 C), temperature source Oral, resp. rate 16, last menstrual period 12/06/2016, SpO2 100 %.  Physical Exam  Constitutional: She is oriented to person, place, and time. She appears well-developed and well-nourished. No distress.  HENT:  Head: Normocephalic and atraumatic.  Neck: Normal range of motion.  Cardiovascular: Normal rate.   Respiratory: Effort normal.  GI: Soft. She exhibits no distension. There is no tenderness.  gravid  Genitourinary:  Genitourinary Comments: SSE: no pool, thick white discharge SVE: closed/thick  Musculoskeletal: Normal range of motion.  Neurological: She is alert and oriented to person, place, and time.  Skin: Skin is warm and dry.  Psychiatric: She has a normal mood and affect.  EFM: 145 bpm, mod variability, + accels, no decels Toco: 2-7  Results for orders placed or performed during the hospital encounter of 07/03/17 (from the past 24 hour(s))  Urinalysis, Routine w reflex microscopic     Status: Abnormal   Collection Time: 07/03/17  9:55 AM  Result Value Ref Range   Color, Urine YELLOW YELLOW   APPearance HAZY (A) CLEAR   Specific Gravity, Urine 1.012 1.005 - 1.030   pH 7.0 5.0 - 8.0   Glucose, UA NEGATIVE NEGATIVE mg/dL   Hgb urine dipstick NEGATIVE NEGATIVE   Bilirubin Urine NEGATIVE NEGATIVE   Ketones, ur NEGATIVE NEGATIVE mg/dL   Protein, ur NEGATIVE NEGATIVE mg/dL   Nitrite NEGATIVE NEGATIVE   Leukocytes, UA NEGATIVE NEGATIVE  Wet prep, genital     Status: Abnormal   Collection Time: 07/03/17 10:35 AM  Result Value Ref Range   Yeast Wet Prep HPF POC NONE SEEN NONE SEEN   Trich, Wet Prep NONE SEEN NONE SEEN   Clue Cells Wet Prep HPF POC NONE SEEN NONE SEEN   WBC, Wet Prep HPF POC FEW (A) NONE SEEN   Sperm NONE SEEN   Amnisure rupture of membrane (rom)not at [facility]      Status: None   Collection Time: 07/03/17 10:56 AM  Result Value Ref Range   Amnisure ROM NEGATIVE    MAU Course  Procedures Po hydration Procardia x2 doses  MDM Labs ordered and reviewed. Pt feeling less ctx now, some minor cramping. No evidence of PTL, SROM, UTI, or infection. Discussed good po hydration. Stable for discharge home.  Assessment and Plan   1. [redacted] weeks gestation of pregnancy   2. Supervision of other high risk pregnancy, antepartum   3. NST (non-stress test) reactive   4. Preterm uterine contractions in third trimester, antepartum   5. Vaginal discharge during pregnancy in third trimester    Discharge home Follow up in OB office as scheduled next week PTL precautions  Allergies as of 07/03/2017   No Known Allergies     Medication List    TAKE these medications   aspirin EC 81 MG tablet Take 1 tablet (81 mg total) by mouth daily.   doxylamine (Sleep) 25 MG tablet Commonly known as:  UNISOM Take 25 mg by mouth at bedtime as needed (for nausea).   prenatal multivitamin Tabs tablet Take 1 tablet by mouth at bedtime.   pyridOXINE 50 MG tablet Commonly known as:  VITAMIN B-6 Take 1 tablet (50 mg total) by mouth 2 (two) times daily. What changed:  when to take this  reasons to take this      Donette Larry, CNM 07/03/2017, 10:40 AM

## 2017-07-03 NOTE — MAU Note (Signed)
Pt reports for the last 2 weeks she has had contractions that have awakened her during the night. States last night the contractions were a lot worse and she had some clear watery discharge. States she has been having pressure in her lower abd also.

## 2017-07-04 LAB — GC/CHLAMYDIA PROBE AMP (~~LOC~~) NOT AT ARMC
CHLAMYDIA, DNA PROBE: NEGATIVE
NEISSERIA GONORRHEA: NEGATIVE

## 2017-07-10 ENCOUNTER — Ambulatory Visit (INDEPENDENT_AMBULATORY_CARE_PROVIDER_SITE_OTHER): Payer: Managed Care, Other (non HMO) | Admitting: Advanced Practice Midwife

## 2017-07-10 VITALS — BP 99/67 | HR 74 | Wt 174.6 lb

## 2017-07-10 DIAGNOSIS — Z8759 Personal history of other complications of pregnancy, childbirth and the puerperium: Secondary | ICD-10-CM

## 2017-07-10 DIAGNOSIS — O10913 Unspecified pre-existing hypertension complicating pregnancy, third trimester: Secondary | ICD-10-CM

## 2017-07-10 DIAGNOSIS — O09899 Supervision of other high risk pregnancies, unspecified trimester: Secondary | ICD-10-CM

## 2017-07-10 DIAGNOSIS — O09893 Supervision of other high risk pregnancies, third trimester: Secondary | ICD-10-CM

## 2017-07-10 DIAGNOSIS — O10919 Unspecified pre-existing hypertension complicating pregnancy, unspecified trimester: Secondary | ICD-10-CM

## 2017-07-10 NOTE — Patient Instructions (Addendum)
Braxton Hicks Contractions Contractions of the uterus can occur throughout pregnancy, but they are not always a sign that you are in labor. You may have practice contractions called Braxton Hicks contractions. These false labor contractions are sometimes confused with true labor. What are Braxton Hicks contractions? Braxton Hicks contractions are tightening movements that occur in the muscles of the uterus before labor. Unlike true labor contractions, these contractions do not result in opening (dilation) and thinning of the cervix. Toward the end of pregnancy (32-34 weeks), Braxton Hicks contractions can happen more often and may become stronger. These contractions are sometimes difficult to tell apart from true labor because they can be very uncomfortable. You should not feel embarrassed if you go to the hospital with false labor. Sometimes, the only way to tell if you are in true labor is for your health care provider to look for changes in the cervix. The health care provider will do a physical exam and may monitor your contractions. If you are not in true labor, the exam should show that your cervix is not dilating and your water has not broken. If there are no prenatal problems or other health problems associated with your pregnancy, it is completely safe for you to be sent home with false labor. You may continue to have Braxton Hicks contractions until you go into true labor. How can I tell the difference between true labor and false labor?  Differences ? False labor ? Contractions last 30-70 seconds.: Contractions are usually shorter and not as strong as true labor contractions. ? Contractions become very regular.: Contractions are usually irregular. ? Discomfort is usually felt in the top of the uterus, and it spreads to the lower abdomen and low back.: Contractions are often felt in the front of the lower abdomen and in the groin. ? Contractions do not go away with walking.: Contractions may  go away when you walk around or change positions while lying down. ? Contractions usually become more intense and increase in frequency.: Contractions get weaker and are shorter-lasting as time goes on. ? The cervix dilates and gets thinner.: The cervix usually does not dilate or become thin. Follow these instructions at home:  Take over-the-counter and prescription medicines only as told by your health care provider.  Keep up with your usual exercises and follow other instructions from your health care provider.  Eat and drink lightly if you think you are going into labor.  If Braxton Hicks contractions are making you uncomfortable: ? Change your position from lying down or resting to walking, or change from walking to resting. ? Sit and rest in a tub of warm water. ? Drink enough fluid to keep your urine clear or pale yellow. Dehydration may cause these contractions. ? Do slow and deep breathing several times an hour.  Keep all follow-up prenatal visits as told by your health care provider. This is important. Contact a health care provider if:  You have a fever.  You have continuous pain in your abdomen. Get help right away if:  Your contractions become stronger, more regular, and closer together.  You have fluid leaking or gushing from your vagina.  You pass blood-tinged mucus (bloody show).  You have bleeding from your vagina.  You have low back pain that you never had before.  You feel your baby's head pushing down and causing pelvic pressure.  Your baby is not moving inside you as much as it used to. Summary  Contractions that occur before labor are   called Braxton Hicks contractions, false labor, or practice contractions.  Braxton Hicks contractions are usually shorter, weaker, farther apart, and less regular than true labor contractions. True labor contractions usually become progressively stronger and regular and they become more frequent.  Manage discomfort from  [facility] contractions by changing position, resting in a warm bath, drinking plenty of water, or practicing deep breathing. This information is not intended to replace advice given to you by your health care provider. Make sure you discuss any questions you have with your health care provider. Document Released: 08/27/2005 Document Revised: 07/16/2016 Document Reviewed: 07/16/2016 Elsevier Interactive Patient Education  2017 [employer].   Contraception Choices Contraception (birth control) is the use of any methods or devices to prevent pregnancy. Below are some methods to help avoid pregnancy. Hormonal methods  Contraceptive implant. This is a thin, plastic tube containing progesterone hormone. It does not contain estrogen hormone. Your health care provider inserts the tube in the inner part of the upper arm. The tube can remain in place for up to 3 years. After 3 years, the implant must be removed. The implant prevents the ovaries from releasing an egg (ovulation), thickens the cervical mucus to prevent sperm from entering the uterus, and thins the lining of the inside of the uterus.  Progesterone-only injections. These injections are given every 3 months by your health care provider to prevent pregnancy. This synthetic progesterone hormone stops the ovaries from releasing eggs. It also thickens cervical mucus and changes the uterine lining. This makes it harder for sperm to survive in the uterus.  Birth control pills. These pills contain estrogen and progesterone hormone. They work by preventing the ovaries from releasing eggs (ovulation). They also cause the cervical mucus to thicken, preventing the sperm from entering the uterus. Birth control pills are prescribed by a health care provider.Birth control pills can also be used to treat heavy periods.  Minipill. This type of birth control pill contains only the progesterone hormone. They are taken every day of each month and must be  prescribed by your health care provider.  Birth control patch. The patch contains hormones similar to those in birth control pills. It must be changed once a week and is prescribed by a health care provider.  Vaginal ring. The ring contains hormones similar to those in birth control pills. It is left in the vagina for 3 weeks, removed for 1 week, and then a new one is put back in place. The patient must be comfortable inserting and removing the ring from the vagina.A health care provider's prescription is necessary.  Emergency contraception. Emergency contraceptives prevent pregnancy after unprotected sexual intercourse. This pill can be taken right after sex or up to 5 days after unprotected sex. It is most effective the sooner you take the pills after having sexual intercourse. Most emergency contraceptive pills are available without a prescription. Check with your pharmacist. Do not use emergency contraception as your only form of birth control. Barrier methods  Female condom. This is a thin sheath (latex or rubber) that is worn over the penis during sexual intercourse. It can be used with spermicide to increase effectiveness.  Female condom. This is a soft, loose-fitting sheath that is put into the vagina before sexual intercourse.  Diaphragm. This is a soft, latex, dome-shaped barrier that must be fitted by a health care provider. It is inserted into the vagina, along with a spermicidal jelly. It is inserted before intercourse. The diaphragm should be left in the vagina  for 6 to 8 hours after intercourse.  Cervical cap. This is a round, soft, latex or plastic cup that fits over the cervix and must be fitted by a health care provider. The cap can be left in place for up to 48 hours after intercourse.  Sponge. This is a soft, circular piece of polyurethane foam. The sponge has spermicide in it. It is inserted into the vagina after wetting it and before sexual intercourse.  Spermicides. These are  chemicals that kill or block sperm from entering the cervix and uterus. They come in the form of creams, jellies, suppositories, foam, or tablets. They do not require a prescription. They are inserted into the vagina with an applicator before having sexual intercourse. The process must be repeated every time you have sexual intercourse. Intrauterine contraception  Intrauterine device (IUD). This is a T-shaped device that is put in a woman's uterus during a menstrual period to prevent pregnancy. There are 2 types: ? Copper IUD. This type of IUD is wrapped in copper wire and is placed inside the uterus. Copper makes the uterus and fallopian tubes produce a fluid that kills sperm. It can stay in place for 10 years. ? Hormone IUD. This type of IUD contains the hormone progestin (synthetic progesterone). The hormone thickens the cervical mucus and prevents sperm from entering the uterus, and it also thins the uterine lining to prevent implantation of a fertilized egg. The hormone can weaken or kill the sperm that get into the uterus. It can stay in place for 3-5 years, depending on which type of IUD is used. Permanent methods of contraception  Female tubal ligation. This is when the woman's fallopian tubes are surgically sealed, tied, or blocked to prevent the egg from traveling to the uterus.  Hysteroscopic sterilization. This involves placing a small coil or insert into each fallopian tube. Your doctor uses a technique called hysteroscopy to do the procedure. The device causes scar tissue to form. This results in permanent blockage of the fallopian tubes, so the sperm cannot fertilize the egg. It takes about 3 months after the procedure for the tubes to become blocked. You must use another form of birth control for these 3 months.  Female sterilization. This is when the female has the tubes that carry sperm tied off (vasectomy).This blocks sperm from entering the vagina during sexual intercourse. After the  procedure, the man can still ejaculate fluid (semen). Natural planning methods  Natural family planning. This is not having sexual intercourse or using a barrier method (condom, diaphragm, cervical cap) on days the woman could become pregnant.  Calendar method. This is keeping track of the length of each menstrual cycle and identifying when you are fertile.  Ovulation method. This is avoiding sexual intercourse during ovulation.  Symptothermal method. This is avoiding sexual intercourse during ovulation, using a thermometer and ovulation symptoms.  Post-ovulation method. This is timing sexual intercourse after you have ovulated. Regardless of which type or method of contraception you choose, it is important that you use condoms to protect against the transmission of sexually transmitted infections (STIs). Talk with your health care provider about which form of contraception is most appropriate for you. This information is not intended to replace advice given to you by your health care provider. Make sure you discuss any questions you have with your health care provider. Document Released: 08/27/2005 Document Revised: 02/02/2016 Document Reviewed: 02/19/2013 Elsevier Interactive Patient Education  2017 [employer]

## 2017-07-10 NOTE — Progress Notes (Addendum)
   PRENATAL VISIT NOTE  Subjective:  [name] is a 36 y.o. [id number] at 3877w6d being seen today for ongoing prenatal care.  She is currently monitored for the following issues for this high-risk pregnancy and has Cigarette smoker; History of chlamydia; History of cryosurgery of cervix complicating pregnancy; Depression with anxiety; Chronic hypertension during pregnancy, antepartum; Migraine headache without aura; Supervision of other high risk pregnancy, antepartum; and History of IUFD on her problem list.  Patient reports occasional contractions.  Contractions: Irregular. Vag. Bleeding: None.  Movement: Present. Denies leaking of fluid.   The following portions of the patient's history were reviewed and updated as appropriate: allergies, current medications, past family history, past medical history, past social history, past surgical history and problem list. Problem list updated.  Objective:   Vitals:   07/10/17 0855  BP: 99/67  Pulse: 74  Weight: 174 lb 9.6 oz (79.2 kg)    Fetal Status: Fetal Heart Rate (bpm): 146 Fundal Height: 21 cm Movement: Present  Presentation: Transverse  General:  Alert, oriented and cooperative. Patient is in no acute distress.  Skin: Skin is warm and dry. No rash noted.   Cardiovascular: Normal heart rate noted  Respiratory: Normal respiratory effort, no problems with respiration noted  Abdomen: Soft, gravid, appropriate for gestational age.  Pain/Pressure: Present     Pelvic: Cervical exam deferred        Extremities: Normal range of motion.  Edema: Trace  Mental Status:  Normal mood and affect. Normal behavior. Normal judgment and thought content.   Assessment and Plan:  Pregnancy: [id number] at 3777w6d  1. History of IUFD - Growth US  - Antenatal testing   2. Supervision of other high risk pregnancy, antepartum   3. Chronic hypertension during pregnancy, antepartum   Preterm labor symptoms and general obstetric precautions including but  not limited to vaginal bleeding, contractions, leaking of fluid and fetal movement were reviewed in detail with the patient. Please refer to After Visit Summary for other counseling recommendations.  Return in 8 days (on 07/18/2017) for Start Antenatal testing. BPP ~11/8, ROB/BPP 2 weeks.   Dorathy KinsmanVirginia Keion Neels, CNM

## 2017-07-16 ENCOUNTER — Ambulatory Visit (HOSPITAL_COMMUNITY)
Admission: RE | Admit: 2017-07-16 | Discharge: 2017-07-16 | Disposition: A | Discharge status: Home / Self Care | Payer: Managed Care, Other (non HMO) | Source: Ambulatory Visit | Attending: Family Medicine | Admitting: Family Medicine

## 2017-07-16 ENCOUNTER — Telehealth: Payer: Self-pay | Admitting: *Deleted

## 2017-07-16 ENCOUNTER — Other Ambulatory Visit: Payer: Self-pay | Admitting: Obstetrics & Gynecology

## 2017-07-16 ENCOUNTER — Other Ambulatory Visit (HOSPITAL_COMMUNITY): Payer: Self-pay | Admitting: Maternal and Fetal Medicine

## 2017-07-16 ENCOUNTER — Other Ambulatory Visit: Payer: Self-pay | Admitting: *Deleted

## 2017-07-16 ENCOUNTER — Encounter (HOSPITAL_COMMUNITY): Payer: Self-pay

## 2017-07-16 DIAGNOSIS — Z3A31 31 weeks gestation of pregnancy: Secondary | ICD-10-CM

## 2017-07-16 DIAGNOSIS — O10913 Unspecified pre-existing hypertension complicating pregnancy, third trimester: Secondary | ICD-10-CM

## 2017-07-16 DIAGNOSIS — O10013 Pre-existing essential hypertension complicating pregnancy, third trimester: Secondary | ICD-10-CM | POA: Insufficient documentation

## 2017-07-16 DIAGNOSIS — O09299 Supervision of pregnancy with other poor reproductive or obstetric history, unspecified trimester: Secondary | ICD-10-CM

## 2017-07-16 DIAGNOSIS — O09293 Supervision of pregnancy with other poor reproductive or obstetric history, third trimester: Secondary | ICD-10-CM | POA: Insufficient documentation

## 2017-07-16 DIAGNOSIS — O99333 Smoking (tobacco) complicating pregnancy, third trimester: Secondary | ICD-10-CM

## 2017-07-16 DIAGNOSIS — O09899 Supervision of other high risk pregnancies, unspecified trimester: Secondary | ICD-10-CM

## 2017-07-16 DIAGNOSIS — O10919 Unspecified pre-existing hypertension complicating pregnancy, unspecified trimester: Secondary | ICD-10-CM

## 2017-07-16 NOTE — Telephone Encounter (Signed)
Voice mail message left for pt stating that we will be making a change to her appt which is currently scheduled on 11/15. The appt will be changed to 11/13 or 11/14 instead. She will be informed of the details at her visit later this week.

## 2017-07-16 NOTE — Progress Notes (Signed)
US for growth scheduled 11/6, BPP added

## 2017-07-17 ENCOUNTER — Other Ambulatory Visit (HOSPITAL_COMMUNITY): Payer: Self-pay | Admitting: *Deleted

## 2017-07-17 DIAGNOSIS — O10919 Unspecified pre-existing hypertension complicating pregnancy, unspecified trimester: Secondary | ICD-10-CM

## 2017-07-18 ENCOUNTER — Encounter: Payer: Self-pay | Admitting: Obstetrics & Gynecology

## 2017-07-18 ENCOUNTER — Ambulatory Visit (INDEPENDENT_AMBULATORY_CARE_PROVIDER_SITE_OTHER): Payer: Managed Care, Other (non HMO) | Admitting: *Deleted

## 2017-07-18 ENCOUNTER — Ambulatory Visit (INDEPENDENT_AMBULATORY_CARE_PROVIDER_SITE_OTHER): Payer: Managed Care, Other (non HMO) | Admitting: Obstetrics & Gynecology

## 2017-07-18 VITALS — BP 102/60 | HR 77 | Wt 174.0 lb

## 2017-07-18 DIAGNOSIS — Z8759 Personal history of other complications of pregnancy, childbirth and the puerperium: Secondary | ICD-10-CM

## 2017-07-18 DIAGNOSIS — O10913 Unspecified pre-existing hypertension complicating pregnancy, third trimester: Secondary | ICD-10-CM | POA: Diagnosis not present

## 2017-07-18 DIAGNOSIS — O09893 Supervision of other high risk pregnancies, third trimester: Secondary | ICD-10-CM

## 2017-07-18 DIAGNOSIS — O10919 Unspecified pre-existing hypertension complicating pregnancy, unspecified trimester: Secondary | ICD-10-CM

## 2017-07-18 DIAGNOSIS — O09899 Supervision of other high risk pregnancies, unspecified trimester: Secondary | ICD-10-CM

## 2017-07-18 NOTE — Patient Instructions (Signed)

## 2017-07-18 NOTE — Progress Notes (Signed)
Pt had US for growth and BPP 8/8 on 11/6 @ [facility].

## 2017-07-18 NOTE — Progress Notes (Signed)
   PRENATAL VISIT NOTE  Subjective:  [name] is a 36 y.o. [id number] at 3171w0d being seen today for ongoing prenatal care.  She is currently monitored for the following issues for this high-risk pregnancy and has Cigarette smoker; History of chlamydia; History of cryosurgery of cervix complicating pregnancy; Depression with anxiety; Chronic hypertension during pregnancy, antepartum; Migraine headache without aura; Supervision of other high risk pregnancy, antepartum; and History of IUFD on their problem list.  Patient reports no complaints.  Contractions: Irregular. Vag. Bleeding: None.  Movement: Present. Denies leaking of fluid.   The following portions of the patient's history were reviewed and updated as appropriate: allergies, current medications, past family history, past medical history, past social history, past surgical history and problem list. Problem list updated.  Objective:   Vitals:   07/18/17 0959  BP: 102/60  Pulse: 77  Weight: 174 lb (78.9 kg)    Fetal Status:     Movement: Present     General:  Alert, oriented and cooperative. Patient is in no acute distress.  Skin: Skin is warm and dry. No rash noted.   Cardiovascular: Normal heart rate noted  Respiratory: Normal respiratory effort, no problems with respiration noted  Abdomen: Soft, gravid, appropriate for gestational age.  Pain/Pressure: Present     Pelvic: Cervical exam deferred        Extremities: Normal range of motion.  Edema: Trace  Mental Status:  Normal mood and affect. Normal behavior. Normal judgment and thought content.   Assessment and Plan:  Pregnancy: [id number] at 471w0d  1. Chronic hypertension during pregnancy, antepartum normotensive  2. Supervision of other high risk pregnancy, antepartum Normal fetal surveillance and NST is reactive  3. History of IUFD At 26 weeks but was induced at 39 weeks both subsequent pregnancies  Preterm labor symptoms and general obstetric precautions  including but not limited to vaginal bleeding, contractions, leaking of fluid and fetal movement were reviewed in detail with the patient. Please refer to After Visit Summary for other counseling recommendations.  Return in about 1 week (around 07/25/2017) for NST BPP.   Scheryl DarterJames Arnold, MD

## 2017-07-18 NOTE — Progress Notes (Signed)
102/60

## 2017-07-23 ENCOUNTER — Ambulatory Visit: Payer: Self-pay

## 2017-07-23 ENCOUNTER — Ambulatory Visit: Payer: Managed Care, Other (non HMO) | Admitting: *Deleted

## 2017-07-23 ENCOUNTER — Ambulatory Visit (INDEPENDENT_AMBULATORY_CARE_PROVIDER_SITE_OTHER): Payer: Managed Care, Other (non HMO) | Admitting: Obstetrics & Gynecology

## 2017-07-23 VITALS — BP 101/62 | HR 85 | Wt 174.8 lb

## 2017-07-23 DIAGNOSIS — O09899 Supervision of other high risk pregnancies, unspecified trimester: Secondary | ICD-10-CM

## 2017-07-23 DIAGNOSIS — O10919 Unspecified pre-existing hypertension complicating pregnancy, unspecified trimester: Secondary | ICD-10-CM

## 2017-07-23 DIAGNOSIS — Z8759 Personal history of other complications of pregnancy, childbirth and the puerperium: Secondary | ICD-10-CM

## 2017-07-23 NOTE — Progress Notes (Signed)
   PRENATAL VISIT NOTE  Subjective:  [name] is a 36 y.o. [id number] at 6245w5d being seen today for ongoing prenatal care.  She is currently monitored for the following issues for this high-risk pregnancy and has Cigarette smoker; History of chlamydia; History of cryosurgery of cervix complicating pregnancy; Depression with anxiety; Chronic hypertension during pregnancy, antepartum; Migraine headache without aura; Supervision of other high risk pregnancy, antepartum; and History of IUFD on their problem list.  Patient reports no complaints.  Contractions: Irregular. Vag. Bleeding: None.  Movement: Present. Denies leaking of fluid.   The following portions of the patient's history were reviewed and updated as appropriate: allergies, current medications, past family history, past medical history, past social history, past surgical history and problem list. Problem list updated.  Objective:   Vitals:   07/23/17 0958  BP: 101/62  Pulse: 85  Weight: 174 lb 12.8 oz (79.3 kg)    Fetal Status: Fetal Heart Rate (bpm): NST   Movement: Present     General:  Alert, oriented and cooperative. Patient is in no acute distress.  Skin: Skin is warm and dry. No rash noted.   Cardiovascular: Normal heart rate noted  Respiratory: Normal respiratory effort, no problems with respiration noted  Abdomen: Soft, gravid, appropriate for gestational age.  Pain/Pressure: Present     Pelvic: Cervical exam deferred        Extremities: Normal range of motion.  Edema: None  Mental Status:  Normal mood and affect. Normal behavior. Normal judgment and thought content.   Assessment and Plan:  Pregnancy: [id number] at 5045w5d  1. Supervision of other high risk pregnancy, antepartum   2. Chronic hypertension during pregnancy, antepartum     Preterm labor symptoms and general obstetric precautions including but not limited to vaginal bleeding, contractions, leaking of fluid and fetal movement were reviewed in  detail with the patient. Please refer to After Visit Summary for other counseling recommendations.  Return in about 8 days (around 07/31/2017) for weekly as scheduled.   Allie BossierMyra C Jorgen Wolfinger, MD

## 2017-07-23 NOTE — Progress Notes (Signed)

## 2017-07-23 NOTE — Progress Notes (Signed)
US for growth scheduled on 12/6.

## 2017-07-24 ENCOUNTER — Inpatient Hospital Stay (HOSPITAL_COMMUNITY)
Admission: AD | Admit: 2017-07-24 | Discharge: 2017-07-24 | Disposition: A | Discharge status: Home / Self Care | Payer: Managed Care, Other (non HMO) | Source: Ambulatory Visit | Attending: Obstetrics and Gynecology | Admitting: Obstetrics and Gynecology

## 2017-07-24 ENCOUNTER — Other Ambulatory Visit: Payer: Self-pay

## 2017-07-24 ENCOUNTER — Encounter (HOSPITAL_COMMUNITY): Payer: Self-pay

## 2017-07-24 DIAGNOSIS — O4703 False labor before 37 completed weeks of gestation, third trimester: Secondary | ICD-10-CM | POA: Insufficient documentation

## 2017-07-24 DIAGNOSIS — Z7982 Long term (current) use of aspirin: Secondary | ICD-10-CM | POA: Diagnosis not present

## 2017-07-24 DIAGNOSIS — Z3A32 32 weeks gestation of pregnancy: Secondary | ICD-10-CM | POA: Diagnosis not present

## 2017-07-24 DIAGNOSIS — R109 Unspecified abdominal pain: Secondary | ICD-10-CM | POA: Diagnosis present

## 2017-07-24 DIAGNOSIS — Z87891 Personal history of nicotine dependence: Secondary | ICD-10-CM | POA: Diagnosis not present

## 2017-07-24 LAB — URINALYSIS, ROUTINE W REFLEX MICROSCOPIC
Bilirubin Urine: NEGATIVE
Glucose, UA: NEGATIVE mg/dL
Hgb urine dipstick: NEGATIVE
KETONES UR: NEGATIVE mg/dL
LEUKOCYTES UA: NEGATIVE
Nitrite: NEGATIVE
PROTEIN: NEGATIVE mg/dL
Specific Gravity, Urine: 1.009 (ref 1.005–1.030)
pH: 7 (ref 5.0–8.0)

## 2017-07-24 LAB — FETAL FIBRONECTIN: FETAL FIBRONECTIN: POSITIVE — AB

## 2017-07-24 MED ORDER — LACTATED RINGERS IV BOLUS (SEPSIS)
1000.0000 mL | Freq: Once | INTRAVENOUS | Status: AC
  Administered 2017-07-24: 1000 mL via INTRAVENOUS

## 2017-07-24 MED ORDER — NIFEDIPINE 10 MG PO CAPS: 10.0000 mg | ORAL_CAPSULE | ORAL | 0 refills | Status: DC | PRN

## 2017-07-24 MED ORDER — BETAMETHASONE SOD PHOS & ACET 6 (3-3) MG/ML IJ SUSP
12.0000 mg | Freq: Once | INTRAMUSCULAR | Status: AC
  Administered 2017-07-24: 12 mg via INTRAMUSCULAR
  Filled 2017-07-24: qty 2

## 2017-07-24 MED ORDER — NIFEDIPINE 10 MG PO CAPS
10.0000 mg | ORAL_CAPSULE | ORAL | Status: DC | PRN
  Administered 2017-07-24: 10 mg via ORAL
  Filled 2017-07-24: qty 1

## 2017-07-24 NOTE — MAU Provider Note (Signed)
Chief Complaint:  Abdominal Pain and Back Pain   First Provider Initiated Contact with Patient 07/24/17 1727      HPI: [name] is a 36 y.o. [id number] at 1832w6dwho presents to maternity admissions reporting onset of cramping/contractions this morning, worsening while at work.  The pain is low in her abdomen and radiates to her low back, intermittent tightening and sharp pain. She has tried drinking water, changing positions, and Tylenol but they have not helped. She is concerned that she cannot keep working with these symptoms.There are no associated symptoms. [facility] and is in antenatal testing weekly for hx of IUFD and CHTN.   She reports good fetal movement, denies LOF, vaginal bleeding, vaginal itching/burning, urinary symptoms, h/a, dizziness, n/v, or fever/chills.    HPI  Past Medical History: Past Medical History:  Diagnosis Date  . Abnormal Pap smear   . Hypertension     Past obstetric history: OB History  Gravida Para Term Preterm AB Living  6 3 2 1 2 2   SAB TAB Ectopic Multiple Live Births  2       2    # Outcome Date GA Lbr Len/2nd Weight Sex Delivery Anes PTL Lv  6 Current           5 Preterm 07/18/17 8761w0d  2 lb (0.907 kg) M Vag-Spont None Y FD  4 Term 11/18/12 1651w0d 06:07 / 01:22 8 lb 4.6 oz (3.759 kg) F Vag-Spont EPI  LIV  3 SAB 10/2007 3589w0d         2 Term 02/14/05 7673w0d  7 lb 15 oz (3.6 kg) M Vag-Spont EPI Y LIV  1 SAB 11/2003 6141w0d             Past Surgical History: Past Surgical History:  Procedure Laterality Date  . CERVIX LESION DESTRUCTION      Family History: Family History  Problem Relation Age of Onset  . Hypertension Mother   . Mental illness Mother        anxiety  . Hypertension Maternal Grandmother   . Cancer Paternal Grandmother   . Cancer Paternal Grandfather     Social History: Social History   Tobacco Use  . Smoking status: Former Smoker    Packs/day: 0.50    Years: 12.00    Pack years: 6.00    Types:  Cigarettes    Last attempt to quit: 06/06/2012    Years since quitting: 5.1  . Smokeless tobacco: Never Used  Substance Use Topics  . Alcohol use: No    Comment: socially  . Drug use: No    Comment: in past 30 days    Allergies: No Known Allergies  Meds:  Medications Prior to Admission  Medication Sig Dispense Refill Last Dose  . aspirin EC 81 MG tablet Take 1 tablet (81 mg total) by mouth daily. 150 tablet 2 Taking  . doxylamine, Sleep, (UNISOM) 25 MG tablet Take 25 mg by mouth at bedtime as needed (for nausea).    Not Taking  . Prenatal Vit-Fe Fumarate-FA (PRENATAL MULTIVITAMIN) TABS tablet Take 1 tablet by mouth at bedtime.   Taking  . pyridOXINE (VITAMIN B-6) 50 MG tablet Take 1 tablet (50 mg total) by mouth 2 (two) times daily. (Patient not taking: Reported on 07/23/2017) 60 tablet 1 Not Taking    ROS:  Review of Systems  Constitutional: Negative for chills, fatigue and fever.  Eyes: Negative for visual disturbance.  Respiratory: Negative for shortness of breath.  Cardiovascular: Negative for chest pain.  Gastrointestinal: Positive for abdominal pain. Negative for nausea and vomiting.  Genitourinary: Positive for pelvic pain. Negative for difficulty urinating, dysuria, flank pain, vaginal bleeding, vaginal discharge and vaginal pain.  Musculoskeletal: Positive for back pain.  Neurological: Negative for dizziness and headaches.  Psychiatric/Behavioral: Negative.      I have reviewed patient's Past Medical Hx, Surgical Hx, Family Hx, Social Hx, medications and allergies.   Physical Exam   Patient Vitals for the past 24 hrs:  BP Temp Temp src Pulse Resp  07/24/17 1822 110/71 - - 83 -  07/24/17 1821 110/71 - - - -  07/24/17 1652 104/61 (!) 97.5 F (36.4 C) Oral 79 18   Constitutional: Well-developed, well-nourished female in no acute distress.  Cardiovascular: normal rate Respiratory: normal effort GI: Abd soft, non-tender, gravid appropriate for gestational age.   MS: Extremities nontender, no edema, normal ROM Neurologic: Alert and oriented x 4.  GU: Neg CVAT.  PELVIC EXAM: Cervix pink, visually closed, without lesion, scant white creamy discharge, vaginal walls and external genitalia normal Bimanual exam: Cervix 0/long/high, firm, anterior, neg CMT, uterus nontender, nonenlarged, adnexa without tenderness, enlargement, or mass  Dilation: Fingertip Effacement (%): 50 Cervical Position: Posterior Exam by:: Mariel Aloe CNM  FHT:  Baseline 135 , moderate variability, accelerations present, no decelerations Contractions: q 3-4 mins, mild to palpation   Labs: Results for orders placed or performed during the hospital encounter of 07/24/17 (from the past 24 hour(s))  Urinalysis, Routine w reflex microscopic     Status: None   Collection Time: 07/24/17  4:45 PM  Result Value Ref Range   Color, Urine YELLOW YELLOW   APPearance CLEAR CLEAR   Specific Gravity, Urine 1.009 1.005 - 1.030   pH 7.0 5.0 - 8.0   Glucose, UA NEGATIVE NEGATIVE mg/dL   Hgb urine dipstick NEGATIVE NEGATIVE   Bilirubin Urine NEGATIVE NEGATIVE   Ketones, ur NEGATIVE NEGATIVE mg/dL   Protein, ur NEGATIVE NEGATIVE mg/dL   Nitrite NEGATIVE NEGATIVE   Leukocytes, UA NEGATIVE NEGATIVE  Fetal fibronectin     Status: Abnormal   Collection Time: 07/24/17  5:40 PM  Result Value Ref Range   Fetal Fibronectin POSITIVE (A) NEGATIVE   B/Positive/-- (06/05 1411)  Imaging:    MAU Course/MDM: I have ordered labs and reviewed results.  NST reviewed and reactive Cervix closed but regular contractions on Toco and to palpation so FFN sent IV fluids and Procardia 10 mg Q 20 min x 3 doses with resolution of contractions in MAU.  FFN positive but cervix unchanged in 3 hours in MAU BMZ dose given today Pt to return to MAU tomorrow for second dose of BMZ Rx for Procardia 10 mg Q 4-6 hours PRN Return to MAU sooner for worsening symptoms F/U in office as scheduled Pt discharge with  strict preterm labor precautions.  Today's evaluation included a work-up for preterm labor which can be life-threatening for both mom and baby.  Assessment: 1. Threatened preterm labor, third trimester     Plan: Discharge home Labor precautions and fetal kick counts Follow-up Information    [facility].   Specialty:  Obstetrics and Gynecology Why:  As scheduled, return to MAU as needed for emergencies Contact information: [address] [place] [address] [phone]         Allergies as of 07/24/2017   No Known Allergies     Medication List    TAKE these medications  aspirin EC 81 MG tablet Take 1 tablet (81 mg total) by mouth daily.   doxylamine (Sleep) 25 MG tablet Commonly known as:  UNISOM Take 25 mg by mouth at bedtime as needed (for nausea).   NIFEdipine 10 MG capsule Commonly known as:  PROCARDIA Take 1 capsule (10 mg total) every 4 (four) hours as needed by mouth.   prenatal multivitamin Tabs tablet Take 1 tablet by mouth at bedtime.   pyridOXINE 50 MG tablet Commonly known as:  VITAMIN B-6 Take 1 tablet (50 mg total) by mouth 2 (two) times daily.       Sharen CounterLisa Leftwich-Kirby Certified Nurse-Midwife 07/24/2017 8:26 PM

## 2017-07-24 NOTE — Progress Notes (Addendum)
[id number] @ 32.[redacted] wksga. Presents to triage for ctx and lower back pain that  Started this morning when waking up.  Denies LOF or bleeding. +FM  EFM applied  1734: provider at bs assessing pt. FFN done. SVE closed   1821: 1st dose procardia 10mg  PO given  1900: pt states feeling better. Ctx spaced out and provider made aware.  1930: Provider made aware of +FFN.   1949: up to bathroom.   Provider at bs reassessing and discussing POC  2017: SVE: ft/50/-3  2030: 1st dose of BMZ given   2039: Provider at bs re-assessing and to answer questions that pt has.   2048: Discharge instructions given with pt understanding. Pt left unit ambulatory with her daughter.

## 2017-07-25 ENCOUNTER — Encounter: Payer: Managed Care, Other (non HMO) | Admitting: Obstetrics & Gynecology

## 2017-07-25 ENCOUNTER — Other Ambulatory Visit: Payer: Managed Care, Other (non HMO)

## 2017-07-25 ENCOUNTER — Inpatient Hospital Stay (HOSPITAL_COMMUNITY)
Admission: AD | Admit: 2017-07-25 | Discharge: 2017-07-25 | Disposition: A | Discharge status: Home / Self Care | Payer: Managed Care, Other (non HMO) | Source: Ambulatory Visit | Attending: Obstetrics & Gynecology | Admitting: Obstetrics & Gynecology

## 2017-07-25 DIAGNOSIS — Z3A32 32 weeks gestation of pregnancy: Secondary | ICD-10-CM | POA: Diagnosis not present

## 2017-07-25 MED ORDER — BETAMETHASONE SOD PHOS & ACET 6 (3-3) MG/ML IJ SUSP
12.0000 mg | Freq: Once | INTRAMUSCULAR | Status: AC
  Administered 2017-07-25: 12 mg via INTRAMUSCULAR
  Filled 2017-07-25: qty 2

## 2017-07-25 NOTE — MAU Note (Signed)
PT  SAYS  ONLY HERE  FOR  BETAMETHASONE - HAS NO C/O .

## 2017-07-31 ENCOUNTER — Ambulatory Visit (INDEPENDENT_AMBULATORY_CARE_PROVIDER_SITE_OTHER): Payer: Managed Care, Other (non HMO) | Admitting: Obstetrics and Gynecology

## 2017-07-31 ENCOUNTER — Ambulatory Visit: Payer: Self-pay

## 2017-07-31 ENCOUNTER — Ambulatory Visit (INDEPENDENT_AMBULATORY_CARE_PROVIDER_SITE_OTHER): Payer: Managed Care, Other (non HMO) | Admitting: *Deleted

## 2017-07-31 ENCOUNTER — Encounter: Payer: Self-pay | Admitting: Obstetrics and Gynecology

## 2017-07-31 VITALS — BP 105/59 | HR 96 | Wt 174.5 lb

## 2017-07-31 DIAGNOSIS — Z8759 Personal history of other complications of pregnancy, childbirth and the puerperium: Secondary | ICD-10-CM

## 2017-07-31 DIAGNOSIS — O10913 Unspecified pre-existing hypertension complicating pregnancy, third trimester: Secondary | ICD-10-CM

## 2017-07-31 DIAGNOSIS — O10919 Unspecified pre-existing hypertension complicating pregnancy, unspecified trimester: Secondary | ICD-10-CM

## 2017-07-31 DIAGNOSIS — O09899 Supervision of other high risk pregnancies, unspecified trimester: Secondary | ICD-10-CM

## 2017-07-31 DIAGNOSIS — O09893 Supervision of other high risk pregnancies, third trimester: Secondary | ICD-10-CM

## 2017-07-31 LAB — POCT URINALYSIS DIP (DEVICE)
Bilirubin Urine: NEGATIVE
Glucose, UA: NEGATIVE mg/dL
Hgb urine dipstick: NEGATIVE
KETONES UR: NEGATIVE mg/dL
Leukocytes, UA: NEGATIVE
Nitrite: NEGATIVE
PH: 7.5 (ref 5.0–8.0)
PROTEIN: NEGATIVE mg/dL
SPECIFIC GRAVITY, URINE: 1.02 (ref 1.005–1.030)
Urobilinogen, UA: 0.2 mg/dL (ref 0.0–1.0)

## 2017-07-31 NOTE — Patient Instructions (Signed)
Contraception Choices Contraception (birth control) is the use of any methods or devices to prevent pregnancy. Below are some methods to help avoid pregnancy. Hormonal methods  Contraceptive implant. This is a thin, plastic tube containing progesterone hormone. It does not contain estrogen hormone. Your health care provider inserts the tube in the inner part of the upper arm. The tube can remain in place for up to 3 years. After 3 years, the implant must be removed. The implant prevents the ovaries from releasing an egg (ovulation), thickens the cervical mucus to prevent sperm from entering the uterus, and thins the lining of the inside of the uterus.  Progesterone-only injections. These injections are given every 3 months by your health care provider to prevent pregnancy. This synthetic progesterone hormone stops the ovaries from releasing eggs. It also thickens cervical mucus and changes the uterine lining. This makes it harder for sperm to survive in the uterus.  Birth control pills. These pills contain estrogen and progesterone hormone. They work by preventing the ovaries from releasing eggs (ovulation). They also cause the cervical mucus to thicken, preventing the sperm from entering the uterus. Birth control pills are prescribed by a health care provider.Birth control pills can also be used to treat heavy periods.  Minipill. This type of birth control pill contains only the progesterone hormone. They are taken every day of each month and must be prescribed by your health care provider.  Birth control patch. The patch contains hormones similar to those in birth control pills. It must be changed once a week and is prescribed by a health care provider.  Vaginal ring. The ring contains hormones similar to those in birth control pills. It is left in the vagina for 3 weeks, removed for 1 week, and then a new one is put back in place. The patient must be comfortable inserting and removing the ring from  the vagina.A health care provider's prescription is necessary.  Emergency contraception. Emergency contraceptives prevent pregnancy after unprotected sexual intercourse. This pill can be taken right after sex or up to 5 days after unprotected sex. It is most effective the sooner you take the pills after having sexual intercourse. Most emergency contraceptive pills are available without a prescription. Check with your pharmacist. Do not use emergency contraception as your only form of birth control. Barrier methods  Female condom. This is a thin sheath (latex or rubber) that is worn over the penis during sexual intercourse. It can be used with spermicide to increase effectiveness.  Female condom. This is a soft, loose-fitting sheath that is put into the vagina before sexual intercourse.  Diaphragm. This is a soft, latex, dome-shaped barrier that must be fitted by a health care provider. It is inserted into the vagina, along with a spermicidal jelly. It is inserted before intercourse. The diaphragm should be left in the vagina for 6 to 8 hours after intercourse.  Cervical cap. This is a round, soft, latex or plastic cup that fits over the cervix and must be fitted by a health care provider. The cap can be left in place for up to 48 hours after intercourse.  Sponge. This is a soft, circular piece of polyurethane foam. The sponge has spermicide in it. It is inserted into the vagina after wetting it and before sexual intercourse.  Spermicides. These are chemicals that kill or block sperm from entering the cervix and uterus. They come in the form of creams, jellies, suppositories, foam, or tablets. They do not require a prescription. They   are inserted into the vagina with an applicator before having sexual intercourse. The process must be repeated every time you have sexual intercourse. Intrauterine contraception  Intrauterine device (IUD). This is a T-shaped device that is put in a woman's uterus during  a menstrual period to prevent pregnancy. There are 2 types: ? Copper IUD. This type of IUD is wrapped in copper wire and is placed inside the uterus. Copper makes the uterus and fallopian tubes produce a fluid that kills sperm. It can stay in place for 10 years. ? Hormone IUD. This type of IUD contains the hormone progestin (synthetic progesterone). The hormone thickens the cervical mucus and prevents sperm from entering the uterus, and it also thins the uterine lining to prevent implantation of a fertilized egg. The hormone can weaken or kill the sperm that get into the uterus. It can stay in place for 3-5 years, depending on which type of IUD is used. Permanent methods of contraception  Female tubal ligation. This is when the woman's fallopian tubes are surgically sealed, tied, or blocked to prevent the egg from traveling to the uterus.  Hysteroscopic sterilization. This involves placing a small coil or insert into each fallopian tube. Your doctor uses a technique called hysteroscopy to do the procedure. The device causes scar tissue to form. This results in permanent blockage of the fallopian tubes, so the sperm cannot fertilize the egg. It takes about 3 months after the procedure for the tubes to become blocked. You must use another form of birth control for these 3 months.  Female sterilization. This is when the female has the tubes that carry sperm tied off (vasectomy).This blocks sperm from entering the vagina during sexual intercourse. After the procedure, the man can still ejaculate fluid (semen). Natural planning methods  Natural family planning. This is not having sexual intercourse or using a barrier method (condom, diaphragm, cervical cap) on days the woman could become pregnant.  Calendar method. This is keeping track of the length of each menstrual cycle and identifying when you are fertile.  Ovulation method. This is avoiding sexual intercourse during ovulation.  Symptothermal method.  This is avoiding sexual intercourse during ovulation, using a thermometer and ovulation symptoms.  Post-ovulation method. This is timing sexual intercourse after you have ovulated. Regardless of which type or method of contraception you choose, it is important that you use condoms to protect against the transmission of sexually transmitted infections (STIs). Talk with your health care provider about which form of contraception is most appropriate for you. This information is not intended to replace advice given to you by your health care provider. Make sure you discuss any questions you have with your health care provider. Document Released: 08/27/2005 Document Revised: 02/02/2016 Document Reviewed: 02/19/2013 Elsevier Interactive Patient Education  2017 [employer]  

## 2017-07-31 NOTE — Progress Notes (Signed)
   PRENATAL VISIT NOTE  Subjective:  [name] is a 36 y.o. [id number] at 7147w6d being seen today for ongoing prenatal care.  She is currently monitored for the following issues for this high-risk pregnancy and has Cigarette smoker; History of chlamydia; History of cryosurgery of cervix complicating pregnancy; Depression with anxiety; Chronic hypertension during pregnancy, antepartum; Migraine headache without aura; Supervision of other high risk pregnancy, antepartum; and History of IUFD on their problem list.  Patient reports irregular contractions, some stronger than others. Visited [facility] last week for contractions, started on procardia which helped contractions but she does not take because it makes her light-headed. Contractions: Irregular. Vag. Bleeding: None.  Movement: Present. Denies leaking of fluid.   The following portions of the patient's history were reviewed and updated as appropriate: allergies, current medications, past family history, past medical history, past social history, past surgical history and problem list. Problem list updated.  Objective:   Vitals:   07/31/17 1057  BP: (!) 105/59  Pulse: 96  Weight: 174 lb 8 oz (79.2 kg)    Fetal Status: Fetal Heart Rate (bpm): NST   Movement: Present     General:  Alert, oriented and cooperative. Patient is in no acute distress.  Skin: Skin is warm and dry. No rash noted.   Cardiovascular: Normal heart rate noted  Respiratory: Normal respiratory effort, no problems with respiration noted  Abdomen: Soft, gravid, appropriate for gestational age.  Pain/Pressure: Present     Pelvic: Cervical exam deferred        Extremities: Normal range of motion.  Edema: None  Mental Status:  Normal mood and affect. Normal behavior. Normal judgment and thought content.   Assessment and Plan:  Pregnancy: [id number] at 7547w6d  1. Supervision of other high risk pregnancy, antepartum Gave info regarding contraception  2. Chronic hypertension  during pregnancy, antepartum BPP 10/10 today Cont weekly NST/BPP Induction for 38/39 weeks  3. History of IUFD Weekly testing  Preterm labor symptoms and general obstetric precautions including but not limited to vaginal bleeding, contractions, leaking of fluid and fetal movement were reviewed in detail with the patient. Please refer to After Visit Summary for other counseling recommendations.  Return in about 8 days (around 08/08/2017) for as scheduled.   Conan BowensKelly M Melvena Vink, MD

## 2017-07-31 NOTE — Progress Notes (Signed)

## 2017-07-31 NOTE — Progress Notes (Signed)
Pt reports losing her mucous plug and requests cervical exam. US for growth and BPP scheduled on 12/6.

## 2017-08-08 ENCOUNTER — Ambulatory Visit (INDEPENDENT_AMBULATORY_CARE_PROVIDER_SITE_OTHER): Payer: Managed Care, Other (non HMO) | Admitting: Obstetrics and Gynecology

## 2017-08-08 ENCOUNTER — Ambulatory Visit: Payer: Self-pay

## 2017-08-08 ENCOUNTER — Encounter: Payer: Self-pay | Admitting: Obstetrics and Gynecology

## 2017-08-08 ENCOUNTER — Ambulatory Visit (INDEPENDENT_AMBULATORY_CARE_PROVIDER_SITE_OTHER): Payer: Managed Care, Other (non HMO) | Admitting: *Deleted

## 2017-08-08 VITALS — BP 106/59 | HR 86 | Wt 175.6 lb

## 2017-08-08 DIAGNOSIS — O10913 Unspecified pre-existing hypertension complicating pregnancy, third trimester: Secondary | ICD-10-CM | POA: Diagnosis not present

## 2017-08-08 DIAGNOSIS — O10919 Unspecified pre-existing hypertension complicating pregnancy, unspecified trimester: Secondary | ICD-10-CM

## 2017-08-08 DIAGNOSIS — Z8759 Personal history of other complications of pregnancy, childbirth and the puerperium: Secondary | ICD-10-CM

## 2017-08-08 DIAGNOSIS — O09899 Supervision of other high risk pregnancies, unspecified trimester: Secondary | ICD-10-CM

## 2017-08-08 DIAGNOSIS — O09893 Supervision of other high risk pregnancies, third trimester: Secondary | ICD-10-CM

## 2017-08-08 LAB — POCT URINALYSIS DIP (DEVICE)
Bilirubin Urine: NEGATIVE
GLUCOSE, UA: NEGATIVE mg/dL
Hgb urine dipstick: NEGATIVE
Ketones, ur: NEGATIVE mg/dL
NITRITE: NEGATIVE
PROTEIN: NEGATIVE mg/dL
SPECIFIC GRAVITY, URINE: 1.02 (ref 1.005–1.030)
UROBILINOGEN UA: 0.2 mg/dL (ref 0.0–1.0)
pH: 7 (ref 5.0–8.0)

## 2017-08-08 NOTE — Progress Notes (Signed)

## 2017-08-08 NOTE — Progress Notes (Signed)
US for growth and BPP scheduled on 12/6

## 2017-08-08 NOTE — Progress Notes (Signed)
   PRENATAL VISIT NOTE  Subjective:  [name] is a 36 y.o. [id number] at 429w0d being seen today for ongoing prenatal care.  She is currently monitored for the following issues for this high-risk pregnancy and has Cigarette smoker; History of chlamydia; History of cryosurgery of cervix complicating pregnancy; Depression with anxiety; Chronic hypertension during pregnancy, antepartum; Migraine headache without aura; Supervision of other high risk pregnancy, antepartum; and History of IUFD on their problem list.  Patient reports occasional contractions.  Contractions: Irregular. Vag. Bleeding: None.  Movement: Present. Denies leaking of fluid.   The following portions of the patient's history were reviewed and updated as appropriate: allergies, current medications, past family history, past medical history, past social history, past surgical history and problem list. Problem list updated.  Objective:   Vitals:   08/08/17 0954  BP: (!) 106/59  Pulse: 86  Weight: 175 lb 9.6 oz (79.7 kg)    Fetal Status: Fetal Heart Rate (bpm): NST   Movement: Present     General:  Alert, oriented and cooperative. Patient is in no acute distress.  Skin: Skin is warm and dry. No rash noted.   Cardiovascular: Normal heart rate noted  Respiratory: Normal respiratory effort, no problems with respiration noted  Abdomen: Soft, gravid, appropriate for gestational age.  Pain/Pressure: Present     Pelvic: Cervical exam deferred        Extremities: Normal range of motion.  Edema: None  Mental Status:  Normal mood and affect. Normal behavior. Normal judgment and thought content.   Assessment and Plan:  Pregnancy: [id number] at 7529w0d  1. Supervision of other high risk pregnancy, antepartum  2. Chronic hypertension during pregnancy, antepartum Weekly testing NST/BPP today 10/10 growth US scheduled for 12/6  3. History of IUFD   Preterm labor symptoms and general obstetric precautions including but not  limited to vaginal bleeding, contractions, leaking of fluid and fetal movement were reviewed in detail with the patient. Please refer to After Visit Summary for other counseling recommendations.  Return in about 1 week (around 08/15/2017) for weekly as scheduled, OB visit (MD).   Conan BowensKelly M Davis, MD

## 2017-08-13 ENCOUNTER — Ambulatory Visit (HOSPITAL_COMMUNITY): Payer: Managed Care, Other (non HMO)

## 2017-08-15 ENCOUNTER — Encounter (HOSPITAL_COMMUNITY): Payer: Self-pay

## 2017-08-15 ENCOUNTER — Ambulatory Visit (INDEPENDENT_AMBULATORY_CARE_PROVIDER_SITE_OTHER): Payer: Managed Care, Other (non HMO) | Admitting: Obstetrics & Gynecology

## 2017-08-15 ENCOUNTER — Ambulatory Visit (INDEPENDENT_AMBULATORY_CARE_PROVIDER_SITE_OTHER): Payer: Managed Care, Other (non HMO) | Admitting: *Deleted

## 2017-08-15 ENCOUNTER — Other Ambulatory Visit (HOSPITAL_COMMUNITY)
Admission: RE | Admit: 2017-08-15 | Discharge: 2017-08-15 | Disposition: A | Discharge status: Home / Self Care | Payer: Managed Care, Other (non HMO) | Source: Ambulatory Visit | Attending: Obstetrics & Gynecology | Admitting: Obstetrics & Gynecology

## 2017-08-15 ENCOUNTER — Ambulatory Visit (HOSPITAL_COMMUNITY)
Admission: RE | Admit: 2017-08-15 | Discharge: 2017-08-15 | Disposition: A | Discharge status: Home / Self Care | Payer: Managed Care, Other (non HMO) | Source: Ambulatory Visit | Attending: Family Medicine | Admitting: Family Medicine

## 2017-08-15 VITALS — BP 113/68 | HR 96 | Wt 180.1 lb

## 2017-08-15 DIAGNOSIS — O0990 Supervision of high risk pregnancy, unspecified, unspecified trimester: Secondary | ICD-10-CM

## 2017-08-15 DIAGNOSIS — O99333 Smoking (tobacco) complicating pregnancy, third trimester: Secondary | ICD-10-CM | POA: Insufficient documentation

## 2017-08-15 DIAGNOSIS — O09293 Supervision of pregnancy with other poor reproductive or obstetric history, third trimester: Secondary | ICD-10-CM | POA: Diagnosis not present

## 2017-08-15 DIAGNOSIS — O10913 Unspecified pre-existing hypertension complicating pregnancy, third trimester: Secondary | ICD-10-CM | POA: Insufficient documentation

## 2017-08-15 DIAGNOSIS — O10919 Unspecified pre-existing hypertension complicating pregnancy, unspecified trimester: Secondary | ICD-10-CM

## 2017-08-15 DIAGNOSIS — O09899 Supervision of other high risk pregnancies, unspecified trimester: Secondary | ICD-10-CM

## 2017-08-15 DIAGNOSIS — Z8759 Personal history of other complications of pregnancy, childbirth and the puerperium: Secondary | ICD-10-CM

## 2017-08-15 DIAGNOSIS — Z3A36 36 weeks gestation of pregnancy: Secondary | ICD-10-CM | POA: Insufficient documentation

## 2017-08-15 DIAGNOSIS — O0993 Supervision of high risk pregnancy, unspecified, third trimester: Secondary | ICD-10-CM

## 2017-08-15 LAB — POCT URINALYSIS DIP (DEVICE)
Bilirubin Urine: NEGATIVE
GLUCOSE, UA: NEGATIVE mg/dL
Hgb urine dipstick: NEGATIVE
Ketones, ur: NEGATIVE mg/dL
NITRITE: NEGATIVE
Protein, ur: NEGATIVE mg/dL
Specific Gravity, Urine: 1.02 (ref 1.005–1.030)
UROBILINOGEN UA: 1 mg/dL (ref 0.0–1.0)
pH: 6.5 (ref 5.0–8.0)

## 2017-08-15 LAB — OB RESULTS CONSOLE GBS: STREP GROUP B AG: POSITIVE

## 2017-08-15 NOTE — Progress Notes (Signed)
US for growth and BPP done today.  Pt states she was told by Dr. Sherrie Georgeecker that she could be induced @ 38 wks due to her BP.

## 2017-08-15 NOTE — Progress Notes (Signed)
   PRENATAL VISIT NOTE  Subjective:  [name] is a 36 y.o. [id number] at 5421w0d being seen today for ongoing prenatal care.  She is currently monitored for the following issues for this high-risk pregnancy and has Cigarette smoker; History of chlamydia; History of cryosurgery of cervix complicating pregnancy; Depression with anxiety; Chronic hypertension during pregnancy, antepartum; Migraine headache without aura; Supervision of other high risk pregnancy, antepartum; and History of IUFD on their problem list.  Patient reports no complaints.  Contractions: Irregular. Vag. Bleeding: None.  Movement: Present. Denies leaking of fluid.   The following portions of the patient's history were reviewed and updated as appropriate: allergies, current medications, past family history, past medical history, past social history, past surgical history and problem list. Problem list updated.  Objective:   Vitals:   08/15/17 1012  BP: 113/68  Pulse: 96  Weight: 180 lb 1.6 oz (81.7 kg)    Fetal Status: Fetal Heart Rate (bpm): NST   Movement: Present     General:  Alert, oriented and cooperative. Patient is in no acute distress.  Skin: Skin is warm and dry. No rash noted.   Cardiovascular: Normal heart rate noted  Respiratory: Normal respiratory effort, no problems with respiration noted  Abdomen: Soft, gravid, appropriate for gestational age.  Pain/Pressure: Present     Pelvic: Cervical exam performed        Extremities: Normal range of motion.  Edema: None  Mental Status:  Normal mood and affect. Normal behavior. Normal judgment and thought content.   Assessment and Plan:  Pregnancy: [id number] at 4321w0d  1. Supervision of high risk pregnancy, antepartum  - GC[facility]Chlamydia [facility] - Strep Gp B NAA  2. Chronic hypertension during pregnancy, antepartum Normotensive no medication  3. History of IUFD Normal fetal surveillance, delivery by 39 weeks  4. Supervision of  other high risk pregnancy, antepartum   Preterm labor symptoms and general obstetric precautions including but not limited to vaginal bleeding, contractions, leaking of fluid and fetal movement were reviewed in detail with the patient. Please refer to After Visit Summary for other counseling recommendations.  Return in about 7 days (around 08/22/2017) for weekly as scheduled.   Scheryl DarterJames Messiyah Waterson, MD

## 2017-08-15 NOTE — Patient Instructions (Signed)

## 2017-08-16 LAB — GC/CHLAMYDIA PROBE AMP (~~LOC~~) NOT AT ARMC
Chlamydia: NEGATIVE
Neisseria Gonorrhea: NEGATIVE

## 2017-08-17 LAB — STREP GP B NAA: STREP GROUP B AG: POSITIVE — AB

## 2017-08-22 ENCOUNTER — Ambulatory Visit: Payer: Self-pay

## 2017-08-22 ENCOUNTER — Ambulatory Visit (INDEPENDENT_AMBULATORY_CARE_PROVIDER_SITE_OTHER): Payer: Managed Care, Other (non HMO) | Admitting: *Deleted

## 2017-08-22 ENCOUNTER — Encounter (HOSPITAL_COMMUNITY): Payer: Self-pay | Admitting: *Deleted

## 2017-08-22 ENCOUNTER — Ambulatory Visit (INDEPENDENT_AMBULATORY_CARE_PROVIDER_SITE_OTHER): Payer: Managed Care, Other (non HMO) | Admitting: Obstetrics and Gynecology

## 2017-08-22 ENCOUNTER — Encounter: Payer: Self-pay | Admitting: *Deleted

## 2017-08-22 ENCOUNTER — Telehealth (HOSPITAL_COMMUNITY): Payer: Self-pay | Admitting: *Deleted

## 2017-08-22 VITALS — BP 105/57 | HR 99 | Wt 179.5 lb

## 2017-08-22 DIAGNOSIS — Z8759 Personal history of other complications of pregnancy, childbirth and the puerperium: Secondary | ICD-10-CM

## 2017-08-22 DIAGNOSIS — O10919 Unspecified pre-existing hypertension complicating pregnancy, unspecified trimester: Secondary | ICD-10-CM

## 2017-08-22 DIAGNOSIS — O10913 Unspecified pre-existing hypertension complicating pregnancy, third trimester: Secondary | ICD-10-CM | POA: Diagnosis not present

## 2017-08-22 DIAGNOSIS — O0990 Supervision of high risk pregnancy, unspecified, unspecified trimester: Secondary | ICD-10-CM

## 2017-08-22 DIAGNOSIS — O09899 Supervision of other high risk pregnancies, unspecified trimester: Secondary | ICD-10-CM

## 2017-08-22 NOTE — Progress Notes (Signed)
   PRENATAL VISIT NOTE  Subjective:  [name] is a 36 y.o. [id number] at 6559w0d being seen today for ongoing prenatal care.  She is currently monitored for the following issues for this high-risk pregnancy and has Cigarette smoker; History of chlamydia; History of cryosurgery of cervix complicating pregnancy; Depression with anxiety; Chronic hypertension during pregnancy, antepartum; Migraine headache without aura; Supervision of other high risk pregnancy, antepartum; and History of IUFD on their problem list.  Patient reports irregular contractions.  Contractions: Irregular. Vag. Bleeding: None.  Movement: Present. Denies leaking of fluid.   The following portions of the patient's history were reviewed and updated as appropriate: allergies, current medications, past family history, past medical history, past social history, past surgical history and problem list. Problem list updated.  Objective:   Vitals:   08/22/17 1010  BP: (!) 105/57  Pulse: 99  Weight: 179 lb 8 oz (81.4 kg)    Fetal Status: Fetal Heart Rate (bpm): NST   Movement: Present     General:  Alert, oriented and cooperative. Patient is in no acute distress.  Skin: Skin is warm and dry. No rash noted.   Cardiovascular: Normal heart rate noted  Respiratory: Normal respiratory effort, no problems with respiration noted  Abdomen: Soft, gravid, appropriate for gestational age.  Pain/Pressure: Present     Pelvic: Cervical exam deferred        Extremities: Normal range of motion.  Edema: None  Mental Status:  Normal mood and affect. Normal behavior. Normal judgment and thought content.   Assessment and Plan:  Pregnancy: [id number] at 1959w0d  1. Supervision of high risk pregnancy, antepartum GBS positive IOL scheduled for 39 weeks  2. Chronic hypertension during pregnancy, antepartum No meds Normotensive today Last growth 08/15/17 81st%tile Stop taking baby ASA today  3. History of IUFD BPP 10/10 today reactive NST      Term labor symptoms and general obstetric precautions including but not limited to vaginal bleeding, contractions, leaking of fluid and fetal movement were reviewed in detail with the patient. Please refer to After Visit Summary for other counseling recommendations.  Return in about 1 week (around 08/29/2017) for as scheduled.   Conan BowensKelly M Davis, MD

## 2017-08-22 NOTE — Telephone Encounter (Signed)
Preadmission screen  

## 2017-08-27 ENCOUNTER — Encounter: Payer: Self-pay | Admitting: *Deleted

## 2017-08-29 ENCOUNTER — Ambulatory Visit (INDEPENDENT_AMBULATORY_CARE_PROVIDER_SITE_OTHER): Payer: Managed Care, Other (non HMO) | Admitting: Obstetrics and Gynecology

## 2017-08-29 ENCOUNTER — Ambulatory Visit: Payer: Self-pay

## 2017-08-29 ENCOUNTER — Ambulatory Visit (INDEPENDENT_AMBULATORY_CARE_PROVIDER_SITE_OTHER): Payer: Managed Care, Other (non HMO) | Admitting: *Deleted

## 2017-08-29 VITALS — BP 96/58 | HR 90 | Wt 184.9 lb

## 2017-08-29 DIAGNOSIS — O10919 Unspecified pre-existing hypertension complicating pregnancy, unspecified trimester: Secondary | ICD-10-CM

## 2017-08-29 DIAGNOSIS — Z8759 Personal history of other complications of pregnancy, childbirth and the puerperium: Secondary | ICD-10-CM

## 2017-08-29 DIAGNOSIS — O10913 Unspecified pre-existing hypertension complicating pregnancy, third trimester: Secondary | ICD-10-CM | POA: Diagnosis not present

## 2017-08-29 DIAGNOSIS — O0990 Supervision of high risk pregnancy, unspecified, unspecified trimester: Secondary | ICD-10-CM

## 2017-08-29 DIAGNOSIS — O99820 Streptococcus B carrier state complicating pregnancy: Secondary | ICD-10-CM | POA: Insufficient documentation

## 2017-08-29 DIAGNOSIS — O0993 Supervision of high risk pregnancy, unspecified, third trimester: Secondary | ICD-10-CM

## 2017-08-29 NOTE — Progress Notes (Signed)

## 2017-08-29 NOTE — Progress Notes (Signed)
IOL scheduled 12/27 @ midnight

## 2017-08-29 NOTE — Progress Notes (Signed)
Prenatal Visit Note Date: 08/29/2017 Clinic: [facility]  Subjective:  [name] is a 36 y.o. [id number] at 6584w0d being seen today for ongoing prenatal care.  She is currently monitored for the following issues for this high-risk pregnancy and has Cigarette smoker; History of chlamydia; History of cryosurgery of cervix complicating pregnancy; Depression with anxiety; Chronic hypertension during pregnancy, antepartum; Migraine headache without aura; Supervision of other high risk pregnancy, antepartum; History of IUFD; and GBS (group B Streptococcus carrier), +RV culture, currently pregnant on their problem list.  Patient reports no complaints.   Contractions: Irregular. Vag. Bleeding: None.  Movement: Present. Denies leaking of fluid.   The following portions of the patient's history were reviewed and updated as appropriate: allergies, current medications, past family history, past medical history, past social history, past surgical history and problem list. Problem list updated.  Objective:   Vitals:   08/29/17 1003  BP: (!) 96/58  Pulse: 90  Weight: 184 lb 14.4 oz (83.9 kg)    Fetal Status: Fetal Heart Rate (bpm): NST   Movement: Present     General:  Alert, oriented and cooperative. Patient is in no acute distress.  Skin: Skin is warm and dry. No rash noted.   Cardiovascular: Normal heart rate noted  Respiratory: Normal respiratory effort, no problems with respiration noted  Abdomen: Soft, gravid, appropriate for gestational age. Pain/Pressure: Present     Pelvic:  Cervical exam deferred        Extremities: Normal range of motion.  Edema: None  Mental Status: Normal mood and affect. Normal behavior. Normal judgment and thought content.   Urinalysis:      Assessment and Plan:  Pregnancy: [id number] at 7684w0d  1. Supervision of high risk pregnancy, antepartum Routine care. [facility]  2. Chronic hypertension during pregnancy, antepartum Doing  well on no meds. Normal growth scan last month. BPP 8/8, cephalic, NST reactive. Has IOL for 12/26 already set up.   3. History of IUFD  4. GBS (group B Streptococcus carrier), +RV culture, currently pregnant tx in labor  Term labor symptoms and general obstetric precautions including but not limited to vaginal bleeding, contractions, leaking of fluid and fetal movement were reviewed in detail with the patient. Please refer to After Visit Summary for other counseling recommendations.  Return in about 6 weeks (around 10/10/2017) for PP visit.  IOL on 12/27.   [place] BingPickens, Ricketta Colantonio, MD

## 2017-09-01 ENCOUNTER — Inpatient Hospital Stay (HOSPITAL_COMMUNITY): Payer: Managed Care, Other (non HMO) | Admitting: Anesthesiology

## 2017-09-01 ENCOUNTER — Encounter (HOSPITAL_COMMUNITY): Payer: Self-pay

## 2017-09-01 ENCOUNTER — Inpatient Hospital Stay (HOSPITAL_COMMUNITY)
Admission: AD | Admit: 2017-09-01 | Discharge: 2017-09-03 | DRG: 807 | Disposition: A | Discharge status: Home / Self Care | Payer: Managed Care, Other (non HMO) | Source: Ambulatory Visit | Attending: Obstetrics & Gynecology | Admitting: Obstetrics & Gynecology

## 2017-09-01 DIAGNOSIS — O99214 Obesity complicating childbirth: Secondary | ICD-10-CM | POA: Diagnosis present

## 2017-09-01 DIAGNOSIS — O99824 Streptococcus B carrier state complicating childbirth: Secondary | ICD-10-CM | POA: Diagnosis not present

## 2017-09-01 DIAGNOSIS — E669 Obesity, unspecified: Secondary | ICD-10-CM | POA: Diagnosis present

## 2017-09-01 DIAGNOSIS — Z87891 Personal history of nicotine dependence: Secondary | ICD-10-CM

## 2017-09-01 DIAGNOSIS — O09899 Supervision of other high risk pregnancies, unspecified trimester: Secondary | ICD-10-CM

## 2017-09-01 DIAGNOSIS — Z3A38 38 weeks gestation of pregnancy: Secondary | ICD-10-CM | POA: Diagnosis not present

## 2017-09-01 DIAGNOSIS — O4292 Full-term premature rupture of membranes, unspecified as to length of time between rupture and onset of labor: Principal | ICD-10-CM | POA: Diagnosis present

## 2017-09-01 LAB — CBC
HEMATOCRIT: 31.4 % — AB (ref 36.0–46.0)
HEMOGLOBIN: 10.3 g/dL — AB (ref 12.0–15.0)
MCH: 28.1 pg (ref 26.0–34.0)
MCHC: 32.8 g/dL (ref 30.0–36.0)
MCV: 85.6 fL (ref 78.0–100.0)
Platelets: 178 10*3/uL (ref 150–400)
RBC: 3.67 MIL/uL — AB (ref 3.87–5.11)
RDW: 14.9 % (ref 11.5–15.5)
WBC: 8 10*3/uL (ref 4.0–10.5)

## 2017-09-01 LAB — TYPE AND SCREEN
ABO/RH(D): B POS
ANTIBODY SCREEN: NEGATIVE

## 2017-09-01 LAB — POCT FERN TEST

## 2017-09-01 MED ORDER — ZOLPIDEM TARTRATE 5 MG PO TABS: 5.0000 mg | ORAL_TABLET | Freq: Every evening | ORAL | Status: DC | PRN

## 2017-09-01 MED ORDER — LACTATED RINGERS IV SOLN
500.0000 mL | Freq: Once | INTRAVENOUS | Status: AC
  Administered 2017-09-01: 500 mL via INTRAVENOUS

## 2017-09-01 MED ORDER — METHYLERGONOVINE MALEATE 0.2 MG/ML IJ SOLN
0.2000 mg | Freq: Once | INTRAMUSCULAR | Status: AC
  Administered 2017-09-01: 0.2 mg via INTRAMUSCULAR

## 2017-09-01 MED ORDER — ONDANSETRON HCL 4 MG PO TABS: 4.0000 mg | ORAL_TABLET | ORAL | Status: DC | PRN

## 2017-09-01 MED ORDER — DIBUCAINE 1 % RE OINT: 1.0000 "application " | TOPICAL_OINTMENT | RECTAL | Status: DC | PRN

## 2017-09-01 MED ORDER — EPHEDRINE 5 MG/ML INJ
10.0000 mg | INTRAVENOUS | Status: DC | PRN
  Filled 2017-09-01: qty 2

## 2017-09-01 MED ORDER — ACETAMINOPHEN 325 MG PO TABS
650.0000 mg | ORAL_TABLET | ORAL | Status: DC | PRN
  Administered 2017-09-01 – 2017-09-02 (×4): 650 mg via ORAL
  Filled 2017-09-01 (×4): qty 2

## 2017-09-01 MED ORDER — FENTANYL CITRATE (PF) 100 MCG/2ML IJ SOLN
100.0000 ug | INTRAMUSCULAR | Status: DC | PRN
  Administered 2017-09-01 (×2): 100 ug via INTRAVENOUS
  Filled 2017-09-01 (×2): qty 2

## 2017-09-01 MED ORDER — LIDOCAINE HCL (PF) 1 % IJ SOLN
INTRAMUSCULAR | Status: DC | PRN
  Administered 2017-09-01 (×2): 4 mL via EPIDURAL

## 2017-09-01 MED ORDER — TETANUS-DIPHTH-ACELL PERTUSSIS 5-2.5-18.5 LF-MCG/0.5 IM SUSP: 0.5000 mL | Freq: Once | INTRAMUSCULAR | Status: DC

## 2017-09-01 MED ORDER — ONDANSETRON HCL 4 MG/2ML IJ SOLN: 4.0000 mg | INTRAMUSCULAR | Status: DC | PRN

## 2017-09-01 MED ORDER — TERBUTALINE SULFATE 1 MG/ML IJ SOLN
0.2500 mg | Freq: Once | INTRAMUSCULAR | Status: DC | PRN
  Filled 2017-09-01: qty 1

## 2017-09-01 MED ORDER — BENZOCAINE-MENTHOL 20-0.5 % EX AERO: 1.0000 "application " | INHALATION_SPRAY | CUTANEOUS | Status: DC | PRN

## 2017-09-01 MED ORDER — OXYCODONE-ACETAMINOPHEN 5-325 MG PO TABS: 2.0000 | ORAL_TABLET | ORAL | Status: DC | PRN

## 2017-09-01 MED ORDER — ONDANSETRON HCL 4 MG/2ML IJ SOLN: 4.0000 mg | Freq: Four times a day (QID) | INTRAMUSCULAR | Status: DC | PRN

## 2017-09-01 MED ORDER — PHENYLEPHRINE 40 MCG/ML (10ML) SYRINGE FOR IV PUSH (FOR BLOOD PRESSURE SUPPORT)
80.0000 ug | PREFILLED_SYRINGE | INTRAVENOUS | Status: DC | PRN
  Filled 2017-09-01: qty 5

## 2017-09-01 MED ORDER — SODIUM CHLORIDE 0.9 % IV SOLN: 250.0000 mL | INTRAVENOUS | Status: DC | PRN

## 2017-09-01 MED ORDER — WITCH HAZEL-GLYCERIN EX PADS: 1.0000 "application " | MEDICATED_PAD | CUTANEOUS | Status: DC | PRN

## 2017-09-01 MED ORDER — ACETAMINOPHEN 325 MG PO TABS: 650.0000 mg | ORAL_TABLET | ORAL | Status: DC | PRN

## 2017-09-01 MED ORDER — DEXTROSE 5 % IV SOLN
5.0000 10*6.[IU] | Freq: Once | INTRAVENOUS | Status: AC
  Administered 2017-09-01: 5 10*6.[IU] via INTRAVENOUS
  Filled 2017-09-01: qty 5

## 2017-09-01 MED ORDER — SOD CITRATE-CITRIC ACID 500-334 MG/5ML PO SOLN: 30.0000 mL | ORAL | Status: DC | PRN

## 2017-09-01 MED ORDER — OXYTOCIN 40 UNITS IN LACTATED RINGERS INFUSION - SIMPLE MED
2.5000 [IU]/h | INTRAVENOUS | Status: DC
  Filled 2017-09-01: qty 1000

## 2017-09-01 MED ORDER — LACTATED RINGERS IV SOLN: 500.0000 mL | Freq: Once | INTRAVENOUS | Status: DC

## 2017-09-01 MED ORDER — SODIUM CHLORIDE 0.9% FLUSH: 3.0000 mL | Freq: Two times a day (BID) | INTRAVENOUS | Status: DC

## 2017-09-01 MED ORDER — OXYTOCIN 40 UNITS IN LACTATED RINGERS INFUSION - SIMPLE MED
1.0000 m[IU]/min | INTRAVENOUS | Status: DC
  Administered 2017-09-01: 2 m[IU]/min via INTRAVENOUS

## 2017-09-01 MED ORDER — LIDOCAINE HCL (PF) 1 % IJ SOLN
30.0000 mL | INTRAMUSCULAR | Status: DC | PRN
  Filled 2017-09-01: qty 30

## 2017-09-01 MED ORDER — PENICILLIN G POT IN DEXTROSE 60000 UNIT/ML IV SOLN
3.0000 10*6.[IU] | INTRAVENOUS | Status: DC
  Administered 2017-09-01: 3 10*6.[IU] via INTRAVENOUS
  Filled 2017-09-01 (×4): qty 50

## 2017-09-01 MED ORDER — IBUPROFEN 600 MG PO TABS
600.0000 mg | ORAL_TABLET | Freq: Four times a day (QID) | ORAL | Status: DC
  Administered 2017-09-01 – 2017-09-03 (×8): 600 mg via ORAL
  Filled 2017-09-01 (×8): qty 1

## 2017-09-01 MED ORDER — PHENYLEPHRINE 40 MCG/ML (10ML) SYRINGE FOR IV PUSH (FOR BLOOD PRESSURE SUPPORT)
80.0000 ug | PREFILLED_SYRINGE | INTRAVENOUS | Status: DC | PRN
  Filled 2017-09-01: qty 5
  Filled 2017-09-01: qty 10

## 2017-09-01 MED ORDER — COCONUT OIL OIL: 1.0000 "application " | TOPICAL_OIL | Status: DC | PRN

## 2017-09-01 MED ORDER — LACTATED RINGERS IV SOLN: 500.0000 mL | INTRAVENOUS | Status: DC | PRN

## 2017-09-01 MED ORDER — PRENATAL MULTIVITAMIN CH
1.0000 | ORAL_TABLET | Freq: Every day | ORAL | Status: DC
  Administered 2017-09-02 – 2017-09-03 (×2): 1 via ORAL
  Filled 2017-09-01 (×2): qty 1

## 2017-09-01 MED ORDER — SENNOSIDES-DOCUSATE SODIUM 8.6-50 MG PO TABS
2.0000 | ORAL_TABLET | ORAL | Status: DC
  Administered 2017-09-02 (×2): 2 via ORAL
  Filled 2017-09-01 (×2): qty 2

## 2017-09-01 MED ORDER — LACTATED RINGERS IV SOLN
INTRAVENOUS | Status: DC
  Administered 2017-09-01 (×2): via INTRAVENOUS

## 2017-09-01 MED ORDER — SIMETHICONE 80 MG PO CHEW: 80.0000 mg | CHEWABLE_TABLET | ORAL | Status: DC | PRN

## 2017-09-01 MED ORDER — DIPHENHYDRAMINE HCL 50 MG/ML IJ SOLN: 12.5000 mg | INTRAMUSCULAR | Status: DC | PRN

## 2017-09-01 MED ORDER — OXYTOCIN BOLUS FROM INFUSION
500.0000 mL | Freq: Once | INTRAVENOUS | Status: AC
  Administered 2017-09-01: 500 mL via INTRAVENOUS

## 2017-09-01 MED ORDER — MEASLES, MUMPS & RUBELLA VAC ~~LOC~~ INJ: 0.5000 mL | INJECTION | Freq: Once | SUBCUTANEOUS | Status: DC

## 2017-09-01 MED ORDER — DIPHENHYDRAMINE HCL 25 MG PO CAPS: 25.0000 mg | ORAL_CAPSULE | Freq: Four times a day (QID) | ORAL | Status: DC | PRN

## 2017-09-01 MED ORDER — FENTANYL 2.5 MCG/ML BUPIVACAINE 1/10 % EPIDURAL INFUSION (WH - ANES)
14.0000 mL/h | INTRAMUSCULAR | Status: DC | PRN
  Administered 2017-09-01: 14 mL/h via EPIDURAL
  Filled 2017-09-01: qty 100

## 2017-09-01 MED ORDER — OXYCODONE-ACETAMINOPHEN 5-325 MG PO TABS: 1.0000 | ORAL_TABLET | ORAL | Status: DC | PRN

## 2017-09-01 MED ORDER — SODIUM CHLORIDE 0.9% FLUSH: 3.0000 mL | INTRAVENOUS | Status: DC | PRN

## 2017-09-01 NOTE — Progress Notes (Signed)
[name] is a 36 y.o. [phone] at 5174w3d by ultrasound admitted for rupture of membranes  Subjective:   Objective: BP 101/64   Pulse 91   Temp 98.4 F (36.9 C) (Oral)   Resp 16   Ht 5\' 4"  (1.626 m)   Wt 184 lb (83.5 kg)   LMP 12/06/2016 (Exact Date)   BMI 31.58 kg/m  No intake/output data recorded. No intake/output data recorded.  FHT:  FHR: 135-140 bpm, variability: moderate,  accelerations:  Present,  decelerations:  Absent UC:   irregular, every 8-10 minutes SVE:   Dilation: 4 Effacement (%): 70 Station: Ballotable Exam by:: A Showfety RN  Labs: Lab Results  Component Value Date   WBC 8.0 09/01/2017   HGB 10.3 (L) 09/01/2017   HCT 31.4 (L) 09/01/2017   MCV 85.6 09/01/2017   PLT 178 09/01/2017    Assessment / Plan: SROM with no labor. will start pit aug  Labor: start pit aug Preeclampsia:  no signs or symptoms of toxicity Fetal Wellbeing:  Category I Pain Control:  IV pain meds I/D:  n/a Anticipated MOD:  NSVD  [name] 09/01/2017, 2:03 PM

## 2017-09-01 NOTE — H&P (Signed)
[name] is a 36 y.o. female [id number] @ 38.3 wks presenting for SROM @ 0500 this morning.. OB History    Gravida Para Term Preterm AB Living   6 3 2 1 2 2    SAB TAB Ectopic Multiple Live Births   2       2     Past Medical History:  Diagnosis Date  . Abnormal Pap smear   . Hypertension    Past Surgical History:  Procedure Laterality Date  . CERVIX LESION DESTRUCTION     Family History: family history includes Cancer in her paternal grandfather and paternal grandmother; Hypertension in her maternal grandmother and mother; Mental illness in her mother. Social History:  reports that she quit smoking about 5 years ago. Her smoking use included cigarettes. She has a 6.00 pack-year smoking history. she has never used smokeless tobacco. She reports that she does not drink alcohol or use drugs.     Maternal Diabetes: No Genetic Screening: Normal Maternal Ultrasounds/Referrals: Normal Fetal Ultrasounds or other Referrals:  None Maternal Substance Abuse:  No Significant Maternal Medications:  None Significant Maternal Lab Results:  None Other Comments:  GBS pos  Review of Systems  Constitutional: Negative.   HENT: Negative.   Eyes: Negative.   Cardiovascular: Negative.   Gastrointestinal: Positive for abdominal pain.  Genitourinary: Negative.   Musculoskeletal: Negative.   Skin: Negative.   Neurological: Negative.   Endo/Heme/Allergies: Negative.   Psychiatric/Behavioral: Negative.    Maternal Medical History:  Reason for admission: Rupture of membranes.   Contractions: Onset was 3-5 hours ago.   Frequency: regular.   Perceived severity is mild.    Fetal activity: Perceived fetal activity is normal.   Last perceived fetal movement was within the past hour.    Prenatal complications: no prenatal complications Prenatal Complications - Diabetes: none.    Dilation: 4.5 Effacement (%): 90 Station: -2 Exam by:: Amber Josephsheryl Anderson RN Blood pressure 114/73, pulse 88,  temperature 97.9 F (36.6 C), temperature source Oral, resp. rate 18, height 5\' 4"  (1.626 m), weight 184 lb (83.5 kg), last menstrual period 12/06/2016. Maternal Exam:  Uterine Assessment: Contraction strength is mild.  Contraction frequency is regular.   Abdomen: Patient reports no abdominal tenderness. Fetal presentation: vertex  Introitus: Normal vulva. Normal vagina.  Ferning test: positive.  Amniotic fluid character: clear.  Pelvis: adequate for delivery.   Cervix: Cervix evaluated by digital exam.     Fetal Exam Fetal Monitor Review: Mode: ultrasound.   Variability: moderate (6-25 bpm).   Pattern: accelerations present.    Fetal State Assessment: Category I - tracings are normal.     Physical Exam  Constitutional: She is oriented to person, place, and time. She appears well-developed and well-nourished.  HENT:  Head: Normocephalic.  Eyes: Pupils are equal, round, and reactive to light.  Neck: Normal range of motion.  Cardiovascular: Normal rate, regular rhythm, normal heart sounds and intact distal pulses.  Respiratory: Effort normal and breath sounds normal.  GI: Soft. Bowel sounds are normal.  Genitourinary: Vagina normal and uterus normal.  Musculoskeletal: Normal range of motion.  Neurological: She is alert and oriented to person, place, and time. She has normal reflexes.  Skin: Skin is warm and dry.  Psychiatric: She has a normal mood and affect. Her behavior is normal. Judgment normal.    Prenatal labs: ABO, Rh: B/Positive/-- (06/05 1411) Antibody: Negative (06/05 1411) Rubella: 7.08 (06/05 1411) RPR: Non Reactive (10/16 0859)  HBsAg: Negative (06/05 1411)  HIV:  GBS: Positive (12/06 1140)   Assessment/Plan: SROM Early labor GBS pos admit   [name] 09/01/2017, 10:24 AM

## 2017-09-01 NOTE — Anesthesia Procedure Notes (Signed)
Epidural Patient location during procedure: OB Start time: 09/01/2017 5:20 PM  Staffing Anesthesiologist: Mal AmabileFoster, Wells Mabe, MD Performed: anesthesiologist   Preanesthetic Checklist Completed: patient identified, site marked, surgical consent, pre-op evaluation, timeout performed, IV checked, risks and benefits discussed and monitors and equipment checked  Epidural Patient position: sitting Prep: site prepped and draped and DuraPrep Patient monitoring: continuous pulse ox and blood pressure Approach: midline Location: L3-L4 Injection technique: LOR air  Needle:  Needle type: Tuohy  Needle gauge: 17 G Needle length: 9 cm and 9 Needle insertion depth: 5 cm cm Catheter type: closed end flexible Catheter size: 19 Gauge Catheter at skin depth: 10 cm Test dose: negative and Other  Assessment Events: blood not aspirated, injection not painful, no injection resistance, negative IV test and no paresthesia  Additional Notes Patient identified. Risks and benefits discussed including failed block, incomplete  Pain control, post dural puncture headache, nerve damage, paralysis, blood pressure Changes, nausea, vomiting, reactions to medications-both toxic and allergic and post Partum back pain. All questions were answered. Patient expressed understanding and wished to proceed. Sterile technique was used throughout procedure. Epidural site was Dressed with sterile barrier dressing. No paresthesias, signs of intravascular injection Or signs of intrathecal spread were encountered.  Patient was more comfortable after the epidural was dosed. Please see RN's note for documentation of vital signs and FHR which are stable.

## 2017-09-01 NOTE — Progress Notes (Signed)
[name] is a 36 y.o. [id number] at 3251w3d by ultrasound admitted for SROM  Subjective:   Objective: BP 100/63   Pulse 80   Temp 98.3 F (36.8 C) (Axillary)   Resp 16   Ht 5\' 4"  (1.626 m)   Wt 184 lb (83.5 kg)   LMP 12/06/2016 (Exact Date)   BMI 31.58 kg/m  No intake/output data recorded. No intake/output data recorded.  FHT:  FHR: 120's bpm, variability: moderate,  accelerations:  Present,  decelerations:  Absent UC:   regular, every 2-4 minutes SVE:   Dilation: 4 Effacement (%): 70 Station: Ballotable Exam by:: A Showfety RN  Labs: Lab Results  Component Value Date   WBC 8.0 09/01/2017   HGB 10.3 (L) 09/01/2017   HCT 31.4 (L) 09/01/2017   MCV 85.6 09/01/2017   PLT 178 09/01/2017    Assessment / Plan: Augmentation of labor, progressing well  Labor: Progressing normally Preeclampsia:  no signs or symptoms of toxicity Fetal Wellbeing:  Category I Pain Control:  IV pain meds I/D:  n/a Anticipated MOD:  NSVD  [name] 09/01/2017, 5:06 PM

## 2017-09-01 NOTE — Anesthesia Preprocedure Evaluation (Signed)
Anesthesia Evaluation  Patient identified by MRN, date of birth, ID band Patient awake    Reviewed: Allergy & Precautions, H&P , Patient's Chart, lab work & pertinent test results  Airway Mallampati: II  TM Distance: >3 FB Neck ROM: full    Dental no notable dental hx. (+) Teeth Intact   Pulmonary neg pulmonary ROS, former smoker,    Pulmonary exam normal breath sounds clear to auscultation       Cardiovascular hypertension, negative cardio ROS Normal cardiovascular exam Rhythm:regular Rate:Normal     Neuro/Psych  Headaches, negative psych ROS   GI/Hepatic negative GI ROS, Neg liver ROS,   Endo/Other  Obesity  Renal/GU negative Renal ROS  negative genitourinary   Musculoskeletal   Abdominal   Peds  Hematology negative hematology ROS (+)   Anesthesia Other Findings   Reproductive/Obstetrics (+) Pregnancy                             Anesthesia Physical Anesthesia Plan  ASA: II  Anesthesia Plan: Epidural   Post-op Pain Management:    Induction:   PONV Risk Score and Plan:   Airway Management Planned:   Additional Equipment:   Intra-op Plan:   Post-operative Plan:   Informed Consent: I have reviewed the patients History and Physical, chart, labs and discussed the procedure including the risks, benefits and alternatives for the proposed anesthesia with the patient or authorized representative who has indicated his/her understanding and acceptance.     Plan Discussed with: Anesthesiologist  Anesthesia Plan Comments:         Anesthesia Quick Evaluation

## 2017-09-01 NOTE — MAU Note (Signed)
SROM today around 5:30 clear fluid,  Contractions every 6 minutes

## 2017-09-02 ENCOUNTER — Encounter (HOSPITAL_COMMUNITY): Payer: Self-pay

## 2017-09-02 LAB — RPR: RPR Ser Ql: NONREACTIVE

## 2017-09-02 NOTE — Anesthesia Postprocedure Evaluation (Signed)
Anesthesia Post Note  Patient: [name]  Procedure(s) Performed: AN AD HOC LABOR EPIDURAL     Patient location during evaluation: Mother Baby Anesthesia Type: Epidural Level of consciousness: awake and alert Pain management: pain level controlled Vital Signs Assessment: post-procedure vital signs reviewed and stable Respiratory status: spontaneous breathing Cardiovascular status: blood pressure returned to baseline Postop Assessment: no headache, no backache, epidural receding, adequate PO intake, no apparent nausea or vomiting and patient able to bend at knees Anesthetic complications: no    Last Vitals:  Vitals:   09/01/17 2025 09/02/17 0030  BP: 105/62 (!) 99/51  Pulse: 82 72  Resp: 18 16  Temp: 36.4 C 36.6 C  SpO2: 100%     Last Pain:  Vitals:   09/02/17 0551  TempSrc:   PainSc: 3    Pain Goal:                 [facility] IncMARSHALL,[employer]

## 2017-09-02 NOTE — Progress Notes (Signed)
Post Partum Day 1 Subjective: no complaints, up ad lib, voiding, tolerating PO and + flatus  Objective: Blood pressure (!) 99/51, pulse 72, temperature 97.8 F (36.6 C), temperature source Oral, resp. rate 16, height 5\' 4"  (1.626 m), weight 171 lb 12.8 oz (77.9 kg), last menstrual period 12/06/2016, SpO2 100 %, unknown if currently breastfeeding.  Physical Exam:  General: alert, cooperative and no distress Lochia: appropriate Uterine Fundus: firm Incision: n/a DVT Evaluation: No evidence of DVT seen on physical exam.  Recent Labs    09/01/17 0931  HGB 10.3*  HCT 31.4*    Assessment/Plan: Plan for discharge tomorrow and Breastfeeding   LOS: 1 day   [name] CNM 09/02/2017, 6:08 AM

## 2017-09-02 NOTE — Lactation Note (Signed)
This note was copied from a baby's chart. Lactation Consultation Note  Patient Name: [name] NWGNF'AToday's Date: 09/02/2017 Reason for consult: Initial assessment  Initial visit at 26 hours of age.  Mom reports good feedings with some soreness.  LC noted baby latched in swaddle and with body turned away from mom.  Baby noted to have shallow narrow gape. LC offered to assist with latching.  Mom pulled blanket off baby to allow STS.   Mom hand expressed a few drops of colostrum.  With minimal assist baby latched well with wide gape, deeply.  Baby now has strong rhythmic sucking and few swallows.  Mom denies pain after initial latch on.  LC reviewed positioning and waiting for wide open mouth.   Mom to call for assist as needed or with pain during latch. [facility] LC resources given and discussed.  Encouraged to feed with early cues on demand.  Early newborn behavior discussed.  Hand expression demonstrated with colostrum visible.  Mom to call for assist as needed.         Maternal Data Has patient been taught Hand Expression?: Yes Does the patient have breastfeeding experience prior to this delivery?: Yes  Feeding Feeding Type: Breast Fed Length of feed: (observed 10 minutes baby continues )  LATCH Score Latch: Grasps breast easily, tongue down, lips flanged, rhythmical sucking.  Audible Swallowing: A few with stimulation  Type of Nipple: Everted at rest and after stimulation  Comfort (Breast/Nipple): Soft / non-tender  Hold (Positioning): Assistance needed to correctly position infant at breast and maintain latch.  LATCH Score: 8  Interventions Interventions: Breast feeding basics reviewed;Support pillows;Assisted with latch;Expressed milk;Hand express;Breast compression  Lactation Tools Discussed/Used     Consult Status Consult Status: Follow-up Date: 09/03/17 Follow-up type: In-patient    Franz DellJana Shoptaw 09/02/2017, 8:28 PM

## 2017-09-02 NOTE — Progress Notes (Signed)
MOB was referred for history of depression/anxiety. * Referral screened out by Clinical Social Worker because none of the following criteria appear to apply: ~ History of anxiety/depression during this pregnancy, or of post-partum depression. ~ Diagnosis of anxiety and/or depression within last 3 years OR * MOB's symptoms currently being treated with medication and/or therapy. Please contact the Clinical Social Worker if needs arise, by MOB request, or if MOB scores greater than 9/yes to question 10 on Edinburgh Postpartum Depression Screen.  Marisol Giambra Boyd-Gilyard, MSW, LCSW Clinical Social Work [phone] 

## 2017-09-03 MED ORDER — SENNOSIDES-DOCUSATE SODIUM 8.6-50 MG PO TABS: 2.0000 | ORAL_TABLET | Freq: Every evening | ORAL | 0 refills | Status: DC | PRN

## 2017-09-03 MED ORDER — IBUPROFEN 600 MG PO TABS: 600.0000 mg | ORAL_TABLET | Freq: Four times a day (QID) | ORAL | 0 refills | Status: DC

## 2017-09-03 NOTE — Progress Notes (Signed)
Parent request formula to supplement breast feeding due to Baby cluster feeding all night. Educated in regards to cluster feeding. Parents have been informed of small tummy size of newborn, taught hand expression and understands the possible consequences of formula to the health of the infant. The possible consequences shared with patient include 1) Loss of confidence in breastfeeding 2) Engorgement 3) Allergic sensitization of baby(asthma/allergies) and 4) decreased milk supply for mother.After discussion of the above the mother decided to formula feed. .The  tool used to give formula supplement will be bottle.

## 2017-09-03 NOTE — Discharge Summary (Signed)
OB Discharge Summary     Patient Name: [name] DOB: 1980-12-22 MRN: [id number]  Date of admission: 09/01/2017 Delivering MD: Wyvonnia DuskyLAWSON, MARIE D   Date of discharge: 09/03/2017  Admitting diagnosis: PREG Intrauterine pregnancy: 5560w3d     Secondary diagnosis:  Active Problems:   Normal labor  Additional problems:  Patient Active Problem List   Diagnosis Date Noted  . Normal labor 09/01/2017  . GBS (group B Streptococcus carrier), +RV culture, currently pregnant 08/29/2017  . History of IUFD 07/10/2017  . Supervision of other high risk pregnancy, antepartum 02/12/2017  . Migraine headache without aura 09/28/2016  . Depression with anxiety 09/24/2013  . Chronic hypertension during pregnancy, antepartum 09/24/2013  . Cigarette smoker 05/29/2012  . History of chlamydia 05/29/2012  . History of cryosurgery of cervix complicating pregnancy 05/29/2012       Discharge diagnosis: Term Pregnancy Delivered                                                                                                Post partum procedures:None  Augmentation: Pitocin  Complications: None  Hospital course:  Onset of Labor With Vaginal Delivery     36 y.o. yo [id number] at 7460w3d was admitted in Latent Labor on 09/01/2017. Patient had an uncomplicated labor course as follows:  Membrane Rupture Time/Date: 8:57 AM ,09/01/2017   Intrapartum Procedures: Episiotomy: None [1]                                         Lacerations:  None [1]  Patient had a delivery of a Viable infant. 09/01/2017  Information for the patient's newborn:  Lillie FragminGreen, Boy Christana [[id number]][[id number]]  Delivery Method: Vaginal, Spontaneous(Filed from Delivery Summary)    Pateint had an uncomplicated postpartum course.  She is ambulating, tolerating a regular diet, passing flatus, and urinating well. Patient is discharged home in stable condition on 09/03/17.   Physical exam  Vitals:   09/02/17 0030 09/02/17 0500 09/02/17 0830 09/03/17  0527  BP: (!) 99/51  104/69 97/61  Pulse: 72  84 65  Resp: 16  18 16   Temp: 97.8 F (36.6 C)  98.5 F (36.9 C)   TempSrc: Oral  Oral   SpO2:   100%   Weight:  77.9 kg (171 lb 12.8 oz)  77.1 kg (170 lb)  Height:       General: alert, cooperative and no distress Lochia: appropriate Uterine Fundus: firm Incision: N/A DVT Evaluation: No evidence of DVT seen on physical exam. Labs: Lab Results  Component Value Date   WBC 8.0 09/01/2017   HGB 10.3 (L) 09/01/2017   HCT 31.4 (L) 09/01/2017   MCV 85.6 09/01/2017   PLT 178 09/01/2017   CMP Latest Ref Rng & Units 02/12/2017  Glucose 65 - 99 mg/dL 69  BUN 6 - 20 mg/dL 7  Creatinine 6.640.57 - 4.031.00 mg/dL 4.740.60  Sodium 259134 - 563144 mmol/L 136  Potassium 3.5 - 5.2 mmol/L 4.0  Chloride 96 - 106 mmol/L 98  CO2 18 - 29 mmol/L 26  Calcium 8.7 - 10.2 mg/dL 9.2  Total Protein 6.0 - 8.5 g/dL 7.1  Total Bilirubin 0.0 - 1.2 mg/dL <1.6<0.2  Alkaline Phos 39 - 117 IU/L 49  AST 0 - 40 IU/L 14  ALT 0 - 32 IU/L 16    Discharge instruction: per After Visit Summary and "Baby and Me Booklet".  After visit meds:  Allergies as of 09/03/2017   No Known Allergies     Medication List    STOP taking these medications   NIFEdipine 10 MG capsule Commonly known as:  PROCARDIA     TAKE these medications   ferrous gluconate 225 (27 Fe) MG tablet Commonly known as:  FERGON Take 240 mg by mouth at bedtime.   ibuprofen 600 MG tablet Commonly known as:  ADVIL,MOTRIN Take 1 tablet (600 mg total) by mouth every 6 (six) hours.   prenatal multivitamin Tabs tablet Take 1 tablet by mouth at bedtime.   senna-docusate 8.6-50 MG tablet Commonly known as:  Senokot-S Take 2 tablets by mouth at bedtime as needed for mild constipation.       Diet: routine diet  Activity: Advance as tolerated. Pelvic rest for 6 weeks.   Outpatient follow up:4 weeks Follow up Appt:No future appointments. Follow up Visit: Follow-up Information    [facility] Schedule an appointment as soon as possible for a visit.   Specialty:  Obstetrics and Gynecology Why:  For postpartum visit Contact information: [address] [place] [id number] [phone]          Postpartum contraception: Undecided  Newborn Data: Live born female  Birth Weight: 7 lb 15.5 oz (3615 g) APGAR: 9, 9  Newborn Delivery   Birth date/time:  09/01/2017 17:29:00 Delivery type:  Vaginal, Spontaneous     Baby Feeding: Breast Disposition:home with mother   09/03/2017 Caryl AdaJazma Braleigh Massoud, DO

## 2017-09-03 NOTE — Lactation Note (Signed)
This note was copied from a baby's chart. Lactation Consultation Note Baby 35 hrs. Old. Baby having a bili serum, will determine d/c home. LC asked mom if she has any questions or concerns about BF. Mom stated that the baby wanted to feed all night long. Discussed newborn feeding habits, cluster feeding, supply and demand. Mom stated she BF her others for 3 months. Mom is breast/formula feeding.  Reviewed I&O, engorgement, management of filling. Mom has a pump. States has understanding.  Patient Name: Boy Roe Coombsaneisha Blanford RUEAV'WToday's Date: 09/03/2017 Reason for consult: Follow-up assessment   Maternal Data    Feeding Feeding Type: Breast Fed Length of feed: 20 min  LATCH Score                   Interventions Interventions: Breast feeding basics reviewed  Lactation Tools Discussed/Used     Consult Status Consult Status: Complete Date: 09/03/17    Charyl DancerCARVER, Katlin Ciszewski G 09/03/2017, 5:18 AM

## 2017-09-03 NOTE — Discharge Instructions (Signed)

## 2017-09-05 ENCOUNTER — Other Ambulatory Visit: Payer: Managed Care, Other (non HMO)

## 2017-09-05 ENCOUNTER — Inpatient Hospital Stay (HOSPITAL_COMMUNITY): Payer: Managed Care, Other (non HMO)

## 2017-09-05 ENCOUNTER — Encounter: Payer: Managed Care, Other (non HMO) | Admitting: Obstetrics and Gynecology

## 2017-10-14 ENCOUNTER — Encounter: Payer: Self-pay | Admitting: Advanced Practice Midwife

## 2017-10-14 ENCOUNTER — Ambulatory Visit (INDEPENDENT_AMBULATORY_CARE_PROVIDER_SITE_OTHER): Payer: Managed Care, Other (non HMO) | Admitting: Advanced Practice Midwife

## 2017-10-14 DIAGNOSIS — I10 Essential (primary) hypertension: Secondary | ICD-10-CM

## 2017-10-14 DIAGNOSIS — Z87891 Personal history of nicotine dependence: Secondary | ICD-10-CM | POA: Insufficient documentation

## 2017-10-14 DIAGNOSIS — Z1389 Encounter for screening for other disorder: Secondary | ICD-10-CM

## 2017-10-14 DIAGNOSIS — Z3009 Encounter for other general counseling and advice on contraception: Secondary | ICD-10-CM

## 2017-10-14 HISTORY — DX: Essential (primary) hypertension: I10

## 2017-10-14 MED ORDER — HYDROCHLOROTHIAZIDE 12.5 MG PO CAPS: 12.5000 mg | ORAL_CAPSULE | Freq: Every day | ORAL | 11 refills | Status: DC

## 2017-10-14 MED ORDER — ETONOGESTREL-ETHINYL ESTRADIOL 0.12-0.015 MG/24HR VA RING: VAGINAL_RING | VAGINAL | 12 refills | Status: DC

## 2017-10-14 NOTE — Patient Instructions (Signed)
Ethinyl Estradiol; Etonogestrel vaginal ring What is this medicine? ETHINYL ESTRADIOL; ETONOGESTREL (ETH in il es tra DYE ole; et oh noe JES trel) vaginal ring is a flexible, vaginal ring used as a contraceptive (birth control method). This medicine combines two types of female hormones, an estrogen and a progestin. This ring is used to prevent ovulation and pregnancy. Each ring is effective for one month. This medicine may be used for other purposes; ask your health care provider or pharmacist if you have questions. COMMON BRAND NAME(S): NuvaRing What should I tell my health care provider before I take this medicine? They need to know if you have or ever had any of these conditions: -abnormal vaginal bleeding -blood vessel disease or blood clots -breast, cervical, endometrial, ovarian, liver, or uterine cancer -diabetes -gallbladder disease -heart disease or recent heart attack -high blood pressure -high cholesterol -kidney disease -liver disease -migraine headaches -stroke -systemic lupus erythematosus (SLE) -tobacco smoker -an unusual or allergic reaction to estrogens, progestins, other medicines, foods, dyes, or preservatives -pregnant or trying to get pregnant -breast-feeding How should I use this medicine? Insert the ring into your vagina as directed. Follow the directions on the prescription label. The ring will remain place for 3 weeks and is then removed for a 1-week break. A new ring is inserted 1 week after the last ring was removed, on the same day of the week. Check often to make sure the ring is still in place, especially before and after sexual intercourse. If the ring was out of the vagina for an unknown amount of time, you may not be protected from pregnancy. Perform a pregnancy test and call your doctor. Do not use more often than directed. A patient package insert for the product will be given with each prescription and refill. Read this sheet carefully each time. The  sheet may change frequently. Contact your pediatrician regarding the use of this medicine in children. Special care may be needed. This medicine has been used in female children who have started having menstrual periods. Overdosage: If you think you have taken too much of this medicine contact a poison control center or emergency room at once. NOTE: This medicine is only for you. Do not share this medicine with others. What if I miss a dose? You will need to replace your vaginal ring once a month as directed. If the ring should slip out, or if you leave it in longer or shorter than you should, contact your health care professional for advice. What may interact with this medicine? Do not take this medicine with the following medication: -dasabuvir; ombitasvir; paritaprevir; ritonavir -ombitasvir; paritaprevir; ritonavir This medicine may also interact with the following medications: -acetaminophen -antibiotics or medicines for infections, especially rifampin, rifabutin, rifapentine, and griseofulvin, and possibly penicillins or tetracyclines -aprepitant -ascorbic acid (vitamin C) -atorvastatin -barbiturate medicines, such as phenobarbital -bosentan -carbamazepine -caffeine -clofibrate -cyclosporine -dantrolene -doxercalciferol -felbamate -grapefruit juice -hydrocortisone -medicines for anxiety or sleeping problems, such as diazepam or temazepam -medicines for diabetes, including pioglitazone -modafinil -mycophenolate -nefazodone -oxcarbazepine -phenytoin -prednisolone -ritonavir or other medicines for HIV infection or AIDS -rosuvastatin -selegiline -soy isoflavones supplements -St. John's wort -tamoxifen or raloxifene -theophylline -thyroid hormones -topiramate -warfarin This list may not describe all possible interactions. Give your health care provider a list of all the medicines, herbs, non-prescription drugs, or dietary supplements you use. Also tell them if you smoke,  drink alcohol, or use illegal drugs. Some items may interact with your medicine. What should I watch for while using   this medicine? Visit your doctor or health care professional for regular checks on your progress. You will need a regular breast and pelvic exam and Pap smear while on this medicine. Use an additional method of contraception during the first cycle that you use this ring. Do not use a diaphragm or female condom, as the ring can interfere with these birth control methods and their proper placement. If you have any reason to think you are pregnant, stop using this medicine right away and contact your doctor or health care professional. If you are using this medicine for hormone related problems, it may take several cycles of use to see improvement in your condition. Smoking increases the risk of getting a blood clot or having a stroke while you are using hormonal birth control, especially if you are more than 35 years old. You are strongly advised not to smoke. This medicine can make your body retain fluid, making your fingers, hands, or ankles swell. Your blood pressure can go up. Contact your doctor or health care professional if you feel you are retaining fluid. This medicine can make you more sensitive to the sun. Keep out of the sun. If you cannot avoid being in the sun, wear protective clothing and use sunscreen. Do not use sun lamps or tanning beds/booths. If you wear contact lenses and notice visual changes, or if the lenses begin to feel uncomfortable, consult your eye care specialist. In some women, tenderness, swelling, or minor bleeding of the gums may occur. Notify your dentist if this happens. Brushing and flossing your teeth regularly may help limit this. See your dentist regularly and inform your dentist of the medicines you are taking. If you are going to have elective surgery, you may need to stop using this medicine before the surgery. Consult your health care professional  for advice. This medicine does not protect you against HIV infection (AIDS) or any other sexually transmitted diseases. What side effects may I notice from receiving this medicine? Side effects that you should report to your doctor or health care professional as soon as possible: -breast tissue changes or discharge -changes in vaginal bleeding during your period or between your periods -chest pain -coughing up blood -dizziness or fainting spells -headaches or migraines -leg, arm or groin pain -severe or sudden headaches -stomach pain (severe) -sudden shortness of breath -sudden loss of coordination, especially on one side of the body -speech problems -symptoms of vaginal infection like itching, irritation or unusual discharge -tenderness in the upper abdomen -vomiting -weakness or numbness in the arms or legs, especially on one side of the body -yellowing of the eyes or skin Side effects that usually do not require medical attention (report to your doctor or health care professional if they continue or are bothersome): -breakthrough bleeding and spotting that continues beyond the 3 initial cycles of pills -breast tenderness -mood changes, anxiety, depression, frustration, anger, or emotional outbursts -increased sensitivity to sun or ultraviolet light -nausea -skin rash, acne, or brown spots on the skin -weight gain (slight) This list may not describe all possible side effects. Call your doctor for medical advice about side effects. You may report side effects to FDA at 1-800-FDA-1088. Where should I keep my medicine? Keep out of the reach of children. Store at room temperature between 15 and 30 degrees C (59 and 86 degrees F) for up to 4 months. The product will expire after 4 months. Protect from light. Throw away any unused medicine after the expiration date. NOTE: This   sheet is a summary. It may not cover all possible information. If you have questions about this medicine, talk  to your doctor, pharmacist, or health care provider.  2018 Elsevier/Gold Standard (2016-05-04 17:00:31)  

## 2017-10-14 NOTE — Progress Notes (Signed)
Subjective:     [name] is a 37 y.o. female who presents for a postpartum visit. She is 6 weeks postpartum following a spontaneous vaginal delivery. I have fully reviewed the prenatal and intrapartum course. The delivery was at 38.3 gestational weeks. Outcome: spontaneous vaginal delivery. Anesthesia: epidural. Postpartum course has been unremarkable. Baby's course has been unremarkable. Baby is feeding by both breast and bottle - Carnation Good Start. Bleeding no bleeding. Bowel function is normal. Bladder function is normal. Patient is not sexually active. Contraception method is none. Patient is interested in the Nuvaring for birth control. Postpartum depression screening: negative.  The following portions of the patient's history were reviewed and updated as appropriate: allergies, current medications, past family history, past medical history, past social history, past surgical history and problem list.  Review of Systems Pertinent items are noted in HPI.   Objective:    Ht 5\' 3"  (1.6 m)   Wt 158 lb (71.7 kg)   LMP 12/06/2016 (Exact Date)   Breastfeeding? Yes   BMI 27.99 kg/m   General:  alert, cooperative and appears stated age   Breasts:  inspection negative, no nipple discharge or bleeding, no masses or nodularity palpable  Lungs: clear to auscultation bilaterally  Heart:  regular rate and rhythm  Abdomen: soft, non-tender; bowel sounds normal; no masses,  no organomegaly   Vulva:  not evaluated  Vagina: not evaluated  Cervix:  not evaluted   Corpus: not examined  Adnexa:  not examined   Rectal Exam: Not performed.        Assessment:     Normal postpartum exam. Pap smear not done at today's visit.   Plan:    1. Contraception: NuvaRing vaginal inserts 2. Re-start HCTZ 12.5mg , FU with PCP 3. Follow up in: 4 weeks for B/P and birth control check or as needed.

## 2017-11-06 ENCOUNTER — Ambulatory Visit: Payer: Self-pay | Admitting: Family Medicine

## 2017-11-11 ENCOUNTER — Ambulatory Visit: Payer: Managed Care, Other (non HMO) | Admitting: Advanced Practice Midwife

## 2017-12-02 ENCOUNTER — Ambulatory Visit: Payer: Managed Care, Other (non HMO) | Admitting: Advanced Practice Midwife

## 2018-06-09 ENCOUNTER — Encounter: Payer: Self-pay | Admitting: Family Medicine

## 2018-06-09 ENCOUNTER — Ambulatory Visit (INDEPENDENT_AMBULATORY_CARE_PROVIDER_SITE_OTHER): Payer: Managed Care, Other (non HMO) | Admitting: Family Medicine

## 2018-06-09 ENCOUNTER — Other Ambulatory Visit: Payer: Self-pay

## 2018-06-09 VITALS — BP 121/82 | HR 76 | Temp 98.0°F | Resp 16 | Ht 63.0 in | Wt 146.1 lb

## 2018-06-09 DIAGNOSIS — F418 Other specified anxiety disorders: Secondary | ICD-10-CM | POA: Diagnosis not present

## 2018-06-09 MED ORDER — SERTRALINE HCL 25 MG PO TABS: ORAL_TABLET | ORAL | 1 refills | Status: DC

## 2018-06-09 NOTE — Assessment & Plan Note (Signed)
Deteriorated.  Pt has 'not felt right' since delivery in Dec 2018.  She is completely overwhelmed and has had to leave work to sit in car and cry.  Does have support at home but feels that she needs to restart medication and start counseling.  Referral placed for counseling and will start Sertraline 25mg  and quickly titrate to 50mg .  Pt able to contract for safety today.  Will follow closely.

## 2018-06-09 NOTE — Patient Instructions (Signed)
Follow up in 3-4 weeks to recheck mood START the Sertraline once daily x2 weeks and then increase to 2 tabs daily (at the same time).  Take w/ food We'll call you with your counseling appt Please call with any questions, concerns or if anything changes I am proud of you for reaching out!

## 2018-06-09 NOTE — Progress Notes (Signed)
   Subjective:    Patient ID: [name], female    DOB: [birth year], 37 y.o.   MRN: [id number]  HPI Depression- pt had baby in December 2018.  Pt reports consistently feeling sad and tearful.  No thoughts of self harm.  'i'm just overwhelmed'.  Pt has help at home and a good support system.  Working full time, 3 kids.  Pt has hx of depression.  On medication 'years ago'- Zoloft.  Pt cannot recall whether medication was helpful.  Pt is interested in counseling.  Pt is not currently working, 'i'm not even able to function at work right now'.  Pt was having to leave work to sit in her car and cry.  Kids say that she's 'always sad and I don't want to do anything with them'.     Review of Systems For ROS see HPI     Objective:   Physical Exam  Constitutional: She is oriented to person, place, and time. She appears well-developed and well-nourished.  Tearful throughout visit  HENT:  Head: Normocephalic and atraumatic.  Neurological: She is alert and oriented to person, place, and time.  Skin: Skin is warm.  Psychiatric: Her behavior is normal. Thought content normal.  Tearful, obviously depressed  Vitals reviewed.         Assessment & Plan:

## 2018-06-13 ENCOUNTER — Ambulatory Visit: Payer: Managed Care, Other (non HMO) | Admitting: Family Medicine

## 2018-06-23 ENCOUNTER — Telehealth: Payer: Self-pay | Admitting: Family Medicine

## 2018-06-23 DIAGNOSIS — Z0279 Encounter for issue of other medical certificate: Secondary | ICD-10-CM

## 2018-06-23 NOTE — Telephone Encounter (Signed)
Received forms from [facility] by email for [facility]. Placed in bin upfront with charge sheet.

## 2018-06-23 NOTE — Telephone Encounter (Signed)
Forms in your box for completion.

## 2018-06-30 ENCOUNTER — Encounter: Payer: Self-pay | Admitting: Family Medicine

## 2018-06-30 ENCOUNTER — Other Ambulatory Visit: Payer: Self-pay

## 2018-06-30 ENCOUNTER — Ambulatory Visit (INDEPENDENT_AMBULATORY_CARE_PROVIDER_SITE_OTHER): Payer: Managed Care, Other (non HMO) | Admitting: Family Medicine

## 2018-06-30 VITALS — BP 118/72 | HR 76 | Temp 98.1°F | Resp 16 | Ht 63.0 in | Wt 147.2 lb

## 2018-06-30 DIAGNOSIS — F418 Other specified anxiety disorders: Secondary | ICD-10-CM | POA: Diagnosis not present

## 2018-06-30 MED ORDER — SERTRALINE HCL 50 MG PO TABS: 50.0000 mg | ORAL_TABLET | Freq: Every day | ORAL | 3 refills | Status: DC

## 2018-06-30 NOTE — Patient Instructions (Signed)
Follow up phone or MyChart the week of 11/4 to see how things are going CONTINUE the Sertraline 50mg  daily We'll see if we can bump up the counseling appt Call with any questions or concerns Hang in there!!!  You're doing great!!!

## 2018-06-30 NOTE — Assessment & Plan Note (Signed)
Ongoing but somewhat improved since last visit.  She reports better motivation, able to get out of bed w/ less difficulty.  Since last visit, she has lost 3 family members.  This is obviously hard for her and her family support system.  She feels that she can no longer 'bother them' as they have their own grief.  Applauded her for calling to schedule counseling.  We will see if we can expedite this appt.  Continue Sertraline 50mg  daily.  Will follow.

## 2018-06-30 NOTE — Telephone Encounter (Signed)
Reviewed forms and I am unable to complete at this time 1) I don't know the dates she has been out of work 2) there are pages missing from the [employer] forms (page 2 of 5) 3) in order for these to be approved, we need as much information as possible

## 2018-06-30 NOTE — Progress Notes (Signed)
   Subjective:    Patient ID: [name], female    DOB: 06-20-1981, 37 y.o.   MRN: [id number]  HPI Depression- since last visit pt has lost 3 family members, one 'really really hurt'.  She is on 50mg  Zoloft x1 week.  Since starting medication, pt is able to get out of bed.  Pt has not been working.  Stopped working due to extreme stress and depression on 9/27.  Pt is trying to return to work on 11/11.  Has counseling scheduled but they are not able to see her until December.  Is on a cancellation list for counseling.  Is somewhat isolated and feels she doesn't have people to talk to.  Kids are good.  No thoughts of self harm.   Review of Systems For ROS see HPI     Objective:   Physical Exam  Constitutional: She is oriented to person, place, and time. She appears well-developed and well-nourished. No distress.  HENT:  Head: Normocephalic and atraumatic.  Neurological: She is alert and oriented to person, place, and time.  Psychiatric: She has a normal mood and affect. Her behavior is normal. Thought content normal.  No tears today Pt is able to speak calmly, at a normal pace and volume  Vitals reviewed.         Assessment & Plan:

## 2018-06-30 NOTE — Telephone Encounter (Signed)
Spoke with [facility] regarding forms. Pt came in for an appt and was able to discuss forms with KT. Received finished forms from [facility]T and faxed.

## 2018-07-02 ENCOUNTER — Telehealth: Payer: Self-pay | Admitting: General Practice

## 2018-07-02 NOTE — Telephone Encounter (Signed)
Paperwork faxed to office. Placed in PCP folder for completion

## 2018-07-02 NOTE — Telephone Encounter (Signed)
Copied from CRM [id number]. Topic: General - Other >> Jul 02, 2018  3:00 PM Tamela Oddi wrote: Reason for CRM: Ramya with [employer] called to request additional information on disability papers that were faxed over to the office.  Please advise.  CB# [phone], Ext. [phone], Ref.# [id number].

## 2018-07-04 NOTE — Telephone Encounter (Signed)
Sorry. I had placed papers in wrong folder.

## 2018-07-04 NOTE — Telephone Encounter (Signed)
Form completed and placed in basket  

## 2018-07-04 NOTE — Telephone Encounter (Signed)
Faxed to fax # listed on the form [phone]

## 2018-07-04 NOTE — Telephone Encounter (Signed)
FYI

## 2018-07-04 NOTE — Telephone Encounter (Signed)
No paperwork in folder.  It appears they just want [place] to call them for more details

## 2018-07-09 ENCOUNTER — Encounter: Payer: Self-pay | Admitting: Family Medicine

## 2018-07-18 ENCOUNTER — Telehealth: Payer: Self-pay | Admitting: Family Medicine

## 2018-07-18 NOTE — Telephone Encounter (Signed)
Pt dropped off forms from [employer], I have placed them in the bin with a charge sheet.

## 2018-07-18 NOTE — Telephone Encounter (Signed)
Paperwork given to PCP for completion.  

## 2018-07-22 NOTE — Telephone Encounter (Signed)
Appt has been scheduled.

## 2018-07-22 NOTE — Telephone Encounter (Signed)
Unable to complete until pt comes for an appt next week

## 2018-07-22 NOTE — Telephone Encounter (Signed)
Noted  

## 2018-07-29 ENCOUNTER — Encounter: Payer: Self-pay | Admitting: Family Medicine

## 2018-07-29 ENCOUNTER — Other Ambulatory Visit: Payer: Self-pay

## 2018-07-29 ENCOUNTER — Ambulatory Visit (INDEPENDENT_AMBULATORY_CARE_PROVIDER_SITE_OTHER): Payer: Self-pay | Admitting: Family Medicine

## 2018-07-29 VITALS — BP 119/78 | HR 78 | Temp 98.7°F | Resp 16 | Ht 63.0 in | Wt 149.4 lb

## 2018-07-29 DIAGNOSIS — M542 Cervicalgia: Secondary | ICD-10-CM

## 2018-07-29 DIAGNOSIS — F418 Other specified anxiety disorders: Secondary | ICD-10-CM

## 2018-07-29 MED ORDER — CYCLOBENZAPRINE HCL 5 MG PO TABS: 5.0000 mg | ORAL_TABLET | Freq: Every day | ORAL | 1 refills | Status: DC

## 2018-07-29 MED ORDER — BUPROPION HCL ER (XL) 150 MG PO TB24: 150.0000 mg | ORAL_TABLET | Freq: Every day | ORAL | 3 refills | Status: DC

## 2018-07-29 MED ORDER — MELOXICAM 15 MG PO TABS: 15.0000 mg | ORAL_TABLET | Freq: Every day | ORAL | 0 refills | Status: DC

## 2018-07-29 NOTE — Progress Notes (Signed)
   Subjective:    Patient ID: [name], female    DOB: 1981-04-15, 37 y.o.   MRN: [id number]  HPI Anxiety- 'everything has just gone downhill'.  35 yr old son has been stealing her car, involved with known gang members, police came to her house.  She sent him to live w/ dad to protect him.  2 younger kids are now living w/ family friend b/c pt was not able to get out of bed to 'do for them'.  Daughter was missing too many days of school (kindergarten) and people were becoming concerned.  Pt is feeling depressed- sad, overwhelmed.  'my job, my kids- it's just a mess'.  States she can't afford counseling.  So tense that she is having near constant neck and shoulder pain.   Review of Systems For ROS see HPI     Objective:   Physical Exam Vitals signs reviewed.  Constitutional:      Appearance: She is not ill-appearing.     Comments: Tearful, disheveled  HENT:     Head: Normocephalic and atraumatic.  Musculoskeletal:        General: Tenderness (TTP over traps w/ obvious spasm) present.  Skin:    General: Skin is warm and dry.  Neurological:     General: No focal deficit present.     Mental Status: She is alert and oriented to person, place, and time.     Cranial Nerves: No cranial nerve deficit.  Psychiatric:     Comments: Alternating between tearful/anxious and flat/withdrawn           Assessment & Plan:  Neck pain- new.  Due to to trapezius spasm.  Start scheduled NSAIDs and add Flexeril prn.  Reviewed supportive care and red flags that should prompt return.  Pt expressed understanding and is in agreement w/ plan.

## 2018-07-29 NOTE — Patient Instructions (Signed)
Follow up in 2 weeks to recheck anxiety CONTINUE the Sertraline daily ADD the Wellbutrin once daily in the morning TAKE the Meloxicam once daily (w/ food) for 7-10 days for the shoulder pain USE the cyclobenzaprine nightly as needed for muscle spasms [facility] [address], [phone] to start counseling ASAP CONSIDER a few days at [facility] to get your feet back underneath you.  You DESERVE to feel better! Call with any questions or concerns Hang in there!!!

## 2018-08-04 ENCOUNTER — Encounter: Payer: Self-pay | Admitting: Family Medicine

## 2018-08-04 NOTE — Telephone Encounter (Signed)
Picked up forms and faxed them to the # provided on the forms. I LM on pt's VM making her aware of this.

## 2018-08-05 NOTE — Telephone Encounter (Signed)
Called and left a message on voicemail at [facility] to find out if there is a different avenue for the patient.

## 2018-08-06 NOTE — Telephone Encounter (Signed)
Called family services again and left a detailed message to return call. Gave my direct extension.

## 2018-08-13 ENCOUNTER — Ambulatory Visit: Payer: Self-pay | Admitting: Family Medicine

## 2018-08-15 ENCOUNTER — Ambulatory Visit: Payer: Self-pay | Admitting: Family Medicine

## 2018-08-15 DIAGNOSIS — Z0289 Encounter for other administrative examinations: Secondary | ICD-10-CM

## 2018-08-27 ENCOUNTER — Ambulatory Visit: Payer: 59 | Admitting: Clinical

## 2018-11-06 ENCOUNTER — Encounter: Payer: Self-pay | Admitting: Family Medicine

## 2018-11-06 MED ORDER — ETONOGESTREL-ETHINYL ESTRADIOL 0.12-0.015 MG/24HR VA RING: VAGINAL_RING | VAGINAL | 12 refills | Status: DC

## 2018-11-06 MED ORDER — HYDROCHLOROTHIAZIDE 12.5 MG PO CAPS: 12.5000 mg | ORAL_CAPSULE | Freq: Every day | ORAL | 11 refills | Status: DC

## 2018-11-06 NOTE — Telephone Encounter (Signed)
Please advise, meds were pended. However, you have not filled them before.

## 2018-12-28 ENCOUNTER — Other Ambulatory Visit: Payer: Self-pay | Admitting: Family Medicine

## 2019-02-06 ENCOUNTER — Telehealth: Payer: Self-pay | Admitting: Family Medicine

## 2019-02-06 NOTE — Telephone Encounter (Signed)
LM asking pt to call back and schedule a cpe appt

## 2019-06-07 NOTE — Assessment & Plan Note (Signed)
Deteriorated.  Will continue Sertraline and add Wellbutrin.  Encouraged her to consider a few days a Behavioral Health to get herself back together.  She says she doesn't think so but will consider it.  Gave her info for [facility] the [place] for sliding fee scale counseling.  Will follow closely.

## 2019-10-30 ENCOUNTER — Other Ambulatory Visit: Payer: Self-pay | Admitting: Family Medicine

## 2019-10-30 ENCOUNTER — Encounter: Payer: Self-pay | Admitting: Family Medicine

## 2019-11-11 LAB — HM PAP SMEAR

## 2019-12-02 ENCOUNTER — Other Ambulatory Visit: Payer: Self-pay

## 2019-12-02 ENCOUNTER — Ambulatory Visit (INDEPENDENT_AMBULATORY_CARE_PROVIDER_SITE_OTHER): Payer: 59 | Admitting: Family Medicine

## 2019-12-02 ENCOUNTER — Encounter: Payer: Self-pay | Admitting: Family Medicine

## 2019-12-02 VITALS — BP 105/75 | HR 66 | Temp 97.8°F | Resp 16 | Ht 63.0 in | Wt 155.3 lb

## 2019-12-02 DIAGNOSIS — Z0000 Encounter for general adult medical examination without abnormal findings: Secondary | ICD-10-CM

## 2019-12-02 DIAGNOSIS — E663 Overweight: Secondary | ICD-10-CM | POA: Diagnosis not present

## 2019-12-02 DIAGNOSIS — E559 Vitamin D deficiency, unspecified: Secondary | ICD-10-CM | POA: Diagnosis not present

## 2019-12-02 MED ORDER — HYDROCHLOROTHIAZIDE 12.5 MG PO CAPS: 12.5000 mg | ORAL_CAPSULE | Freq: Every day | ORAL | 1 refills | Status: DC

## 2019-12-02 NOTE — Assessment & Plan Note (Signed)
Pt's PE WNL w/ exception of being overweight.  UTD on GYN.  Check labs.  Anticipatory guidance provided.  

## 2019-12-02 NOTE — Progress Notes (Signed)
   Subjective:    Patient ID: [name], female    DOB: September 03, 1981, 39 y.o.   MRN: [id number]  HPI CPE- UTD on pap, Tdap.  No concerns today.   Review of Systems Patient reports no vision/ hearing changes, adenopathy,fever, weight change,  persistant/recurrent hoarseness , swallowing issues, chest pain, palpitations, edema, persistant/recurrent cough, hemoptysis, dyspnea (rest/exertional/paroxysmal nocturnal), gastrointestinal bleeding (melena, rectal bleeding), abdominal pain, significant heartburn, bowel changes, GU symptoms (dysuria, hematuria, incontinence), Gyn symptoms (abnormal  bleeding, pain),  syncope, focal weakness, memory loss, numbness & tingling, skin/hair/nail changes, abnormal bruising or bleeding, anxiety, or depression.   This visit occurred during the SARS-CoV-2 public health emergency.  Safety protocols were in place, including screening questions prior to the visit, additional usage of staff PPE, and extensive cleaning of exam room while observing appropriate contact time as indicated for disinfecting solutions.       Objective:   Physical Exam General Appearance:    Alert, cooperative, no distress, appears stated age  Head:    Normocephalic, without obvious abnormality, atraumatic  Eyes:    PERRL, conjunctiva/corneas clear, EOM's intact, fundi    benign, both eyes  Ears:    Normal TM's and external ear canals, both ears  Nose:   Deferred due to COVID  Throat:   Neck:   Supple, symmetrical, trachea midline, no adenopathy;    Thyroid: no enlargement/tenderness/nodules  Back:     Symmetric, no curvature, ROM normal, no CVA tenderness  Lungs:     Clear to auscultation bilaterally, respirations unlabored  Chest Wall:    No tenderness or deformity   Heart:    Regular rate and rhythm, S1 and S2 normal, no murmur, rub   or gallop  Breast Exam:    Deferred to GYN  Abdomen:     Soft, non-tender, bowel sounds active all four quadrants,    no masses, no organomegaly   Genitalia:    Deferred to GYN  Rectal:    Extremities:   Extremities normal, atraumatic, no cyanosis or edema  Pulses:   2+ and symmetric all extremities  Skin:   Skin color, texture, turgor normal, no rashes or lesions  Lymph nodes:   Cervical, supraclavicular, and axillary nodes normal  Neurologic:   CNII-XII intact, normal strength, sensation and reflexes    throughout          Assessment & Plan:

## 2019-12-02 NOTE — Patient Instructions (Signed)
Follow up in 1 year or as needed We'll notify you of your lab results and make any changes if needed Continue to work on healthy diet and regular exercise- you can do it!!! Call with any questions or concerns Stay Safe!  Stay Healthy! Hang in there!!! 

## 2019-12-02 NOTE — Assessment & Plan Note (Signed)
Ongoing issue for pt.  Check labs to risk stratify.  Will follow. 

## 2019-12-03 ENCOUNTER — Other Ambulatory Visit: Payer: Self-pay | Admitting: General Practice

## 2019-12-03 LAB — CBC WITH DIFFERENTIAL/PLATELET
Basophils Absolute: 0 10*3/uL (ref 0.0–0.1)
Basophils Relative: 0.6 % (ref 0.0–3.0)
Eosinophils Absolute: 0 10*3/uL (ref 0.0–0.7)
Eosinophils Relative: 0.7 % (ref 0.0–5.0)
HCT: 40.1 % (ref 36.0–46.0)
Hemoglobin: 13.9 g/dL (ref 12.0–15.0)
Lymphocytes Relative: 37 % (ref 12.0–46.0)
Lymphs Abs: 1.7 10*3/uL (ref 0.7–4.0)
MCHC: 34.7 g/dL (ref 30.0–36.0)
MCV: 88.1 fl (ref 78.0–100.0)
Monocytes Absolute: 0.2 10*3/uL (ref 0.1–1.0)
Monocytes Relative: 4.9 % (ref 3.0–12.0)
Neutro Abs: 2.5 10*3/uL (ref 1.4–7.7)
Neutrophils Relative %: 56.8 % (ref 43.0–77.0)
Platelets: 241 10*3/uL (ref 150.0–400.0)
RBC: 4.55 Mil/uL (ref 3.87–5.11)
RDW: 13 % (ref 11.5–15.5)
WBC: 4.5 10*3/uL (ref 4.0–10.5)

## 2019-12-03 LAB — BASIC METABOLIC PANEL
BUN: 16 mg/dL (ref 6–23)
CO2: 28 mEq/L (ref 19–32)
Calcium: 9.5 mg/dL (ref 8.4–10.5)
Chloride: 101 mEq/L (ref 96–112)
Creatinine, Ser: 0.73 mg/dL (ref 0.40–1.20)
GFR: 108.84 mL/min (ref >60.00)
Glucose, Bld: 73 mg/dL (ref 70–99)
Potassium: 3.3 mEq/L — ABNORMAL LOW (ref 3.5–5.1)
Sodium: 137 mEq/L (ref 135–145)

## 2019-12-03 LAB — LIPID PANEL
Cholesterol: 218 mg/dL — ABNORMAL HIGH (ref 0–200)
HDL: 62.1 mg/dL (ref >39.00)
LDL Cholesterol: 136 mg/dL — ABNORMAL HIGH (ref 0–99)
NonHDL: 156.05
Total CHOL/HDL Ratio: 4
Triglycerides: 101 mg/dL (ref 0.0–149.0)
VLDL: 20.2 mg/dL (ref 0.0–40.0)

## 2019-12-03 LAB — HEPATIC FUNCTION PANEL
ALT: 24 U/L (ref 0–35)
AST: 13 U/L (ref 0–37)
Albumin: 4.5 g/dL (ref 3.5–5.2)
Alkaline Phosphatase: 63 U/L (ref 39–117)
Bilirubin, Direct: 0.1 mg/dL (ref 0.0–0.3)
Total Bilirubin: 0.5 mg/dL (ref 0.2–1.2)
Total Protein: 7.5 g/dL (ref 6.0–8.3)

## 2019-12-03 LAB — TSH: TSH: 1.32 u[IU]/mL (ref 0.35–4.50)

## 2019-12-03 LAB — VITAMIN D 25 HYDROXY (VIT D DEFICIENCY, FRACTURES): VITD: 25 ng/mL — ABNORMAL LOW (ref 30.00–100.00)

## 2019-12-03 MED ORDER — VITAMIN D (ERGOCALCIFEROL) 1.25 MG (50000 UNIT) PO CAPS: 50000.0000 [IU] | ORAL_CAPSULE | ORAL | 0 refills | Status: AC

## 2020-02-02 ENCOUNTER — Other Ambulatory Visit (HOSPITAL_COMMUNITY): Payer: Self-pay | Admitting: Obstetrics & Gynecology

## 2020-06-10 ENCOUNTER — Other Ambulatory Visit: Payer: Self-pay | Admitting: Family Medicine

## 2020-07-08 ENCOUNTER — Encounter: Payer: Self-pay | Admitting: Family Medicine

## 2021-03-08 ENCOUNTER — Encounter: Payer: Self-pay | Admitting: *Deleted
